# Patient Record
Sex: Female | Born: 1985 | Race: Black or African American | Hispanic: No | Marital: Single | Smoking: Never smoker
Health system: Southern US, Community
[De-identification: ages and names within clinical notes are randomized; demographics above are authoritative.]

## PROBLEM LIST (undated history)

## (undated) DIAGNOSIS — I10 Essential (primary) hypertension: Secondary | ICD-10-CM

## (undated) DIAGNOSIS — R87629 Unspecified abnormal cytological findings in specimens from vagina: Secondary | ICD-10-CM

## (undated) DIAGNOSIS — B009 Herpesviral infection, unspecified: Secondary | ICD-10-CM

## (undated) DIAGNOSIS — J45909 Unspecified asthma, uncomplicated: Secondary | ICD-10-CM

## (undated) HISTORY — DX: Herpesviral infection, unspecified: B00.9

## (undated) HISTORY — DX: Unspecified abnormal cytological findings in specimens from vagina: R87.629

## (undated) HISTORY — DX: Essential (primary) hypertension: I10

## (undated) HISTORY — PX: LEEP: SHX91

---

## 2010-06-19 ENCOUNTER — Emergency Department: Payer: Self-pay | Admitting: Emergency Medicine

## 2011-03-21 ENCOUNTER — Emergency Department: Payer: Self-pay | Admitting: Internal Medicine

## 2011-04-13 ENCOUNTER — Emergency Department: Payer: Self-pay | Admitting: Emergency Medicine

## 2011-04-15 ENCOUNTER — Emergency Department: Payer: Self-pay | Admitting: Unknown Physician Specialty

## 2014-02-27 ENCOUNTER — Emergency Department: Payer: Self-pay | Admitting: Emergency Medicine

## 2014-02-27 LAB — CBC
HCT: 37.2 % (ref 35.0–47.0)
HGB: 12.1 g/dL (ref 12.0–16.0)
MCH: 26.8 pg (ref 26.0–34.0)
MCHC: 32.5 g/dL (ref 32.0–36.0)
MCV: 83 fL (ref 80–100)
Platelet: 284 10*3/uL (ref 150–440)
RBC: 4.51 10*6/uL (ref 3.80–5.20)
RDW: 13.3 % (ref 11.5–14.5)
WBC: 9.2 10*3/uL (ref 3.6–11.0)

## 2014-02-27 LAB — BASIC METABOLIC PANEL
Anion Gap: 9 (ref 7–16)
BUN: 9 mg/dL (ref 7–18)
CREATININE: 0.84 mg/dL (ref 0.60–1.30)
Calcium, Total: 8.1 mg/dL — ABNORMAL LOW (ref 8.5–10.1)
Chloride: 110 mmol/L — ABNORMAL HIGH (ref 98–107)
Co2: 26 mmol/L (ref 21–32)
EGFR (African American): >60
EGFR (Non-African Amer.): >60
GLUCOSE: 92 mg/dL (ref 65–99)
Osmolality: 287 (ref 275–301)
Potassium: 3.2 mmol/L — ABNORMAL LOW (ref 3.5–5.1)
SODIUM: 145 mmol/L (ref 136–145)

## 2014-02-27 LAB — TROPONIN I: Troponin-I: <0.02 ng/mL

## 2014-08-25 ENCOUNTER — Emergency Department
Admit: 2014-08-25 | Disposition: A | Discharge status: Home / Self Care | Payer: Self-pay | Admitting: Emergency Medicine

## 2015-03-17 ENCOUNTER — Emergency Department: Payer: Medicaid Other

## 2015-03-17 ENCOUNTER — Emergency Department
Admission: EM | Admit: 2015-03-17 | Discharge: 2015-03-18 | Disposition: A | Discharge status: Home / Self Care | Payer: Medicaid Other | Attending: Emergency Medicine | Admitting: Emergency Medicine

## 2015-03-17 ENCOUNTER — Encounter: Payer: Self-pay | Admitting: Urgent Care

## 2015-03-17 DIAGNOSIS — Z3202 Encounter for pregnancy test, result negative: Secondary | ICD-10-CM | POA: Insufficient documentation

## 2015-03-17 DIAGNOSIS — R1011 Right upper quadrant pain: Secondary | ICD-10-CM

## 2015-03-17 DIAGNOSIS — N3000 Acute cystitis without hematuria: Secondary | ICD-10-CM | POA: Insufficient documentation

## 2015-03-17 DIAGNOSIS — R109 Unspecified abdominal pain: Secondary | ICD-10-CM | POA: Diagnosis present

## 2015-03-17 DIAGNOSIS — R1110 Vomiting, unspecified: Secondary | ICD-10-CM

## 2015-03-17 HISTORY — DX: Unspecified asthma, uncomplicated: J45.909

## 2015-03-17 LAB — URINALYSIS COMPLETE WITH MICROSCOPIC (ARMC ONLY)
BACTERIA UA: NONE SEEN
BILIRUBIN URINE: NEGATIVE
GLUCOSE, UA: NEGATIVE mg/dL
Ketones, ur: NEGATIVE mg/dL
Nitrite: NEGATIVE
Protein, ur: NEGATIVE mg/dL
Specific Gravity, Urine: 1.015 (ref 1.005–1.030)
pH: 7 (ref 5.0–8.0)

## 2015-03-17 LAB — CBC
HCT: 35.9 % (ref 35.0–47.0)
Hemoglobin: 11.3 g/dL — ABNORMAL LOW (ref 12.0–16.0)
MCH: 25.5 pg — ABNORMAL LOW (ref 26.0–34.0)
MCHC: 31.4 g/dL — ABNORMAL LOW (ref 32.0–36.0)
MCV: 81 fL (ref 80.0–100.0)
Platelets: 268 10*3/uL (ref 150–440)
RBC: 4.44 MIL/uL (ref 3.80–5.20)
RDW: 13.5 % (ref 11.5–14.5)
WBC: 11.3 10*3/uL — ABNORMAL HIGH (ref 3.6–11.0)

## 2015-03-17 LAB — COMPREHENSIVE METABOLIC PANEL
ALK PHOS: 53 U/L (ref 38–126)
ALT: 15 U/L (ref 14–54)
AST: 17 U/L (ref 15–41)
Albumin: 3.6 g/dL (ref 3.5–5.0)
Anion gap: 5 (ref 5–15)
BUN: 10 mg/dL (ref 6–20)
CHLORIDE: 112 mmol/L — AB (ref 101–111)
CO2: 25 mmol/L (ref 22–32)
CREATININE: 0.76 mg/dL (ref 0.44–1.00)
Calcium: 8.7 mg/dL — ABNORMAL LOW (ref 8.9–10.3)
GFR calc non Af Amer: >60 mL/min (ref >60)
GLUCOSE: 101 mg/dL — AB (ref 65–99)
Potassium: 3.5 mmol/L (ref 3.5–5.1)
SODIUM: 142 mmol/L (ref 135–145)
TOTAL PROTEIN: 7.5 g/dL (ref 6.5–8.1)
Total Bilirubin: 0.3 mg/dL (ref 0.3–1.2)

## 2015-03-17 LAB — LIPASE, BLOOD: Lipase: 53 U/L — ABNORMAL HIGH (ref 11–51)

## 2015-03-17 LAB — POCT PREGNANCY, URINE: Preg Test, Ur: NEGATIVE

## 2015-03-17 MED ORDER — SODIUM CHLORIDE 0.9 % IV BOLUS (SEPSIS)
1000.0000 mL | Freq: Once | INTRAVENOUS | Status: AC
  Administered 2015-03-18: 1000 mL via INTRAVENOUS

## 2015-03-17 NOTE — ED Provider Notes (Signed)
[facility] Emergency Department Provider Note  ____________________________________________  Time seen: 11:30 PM  I have reviewed the triage vital signs and the nursing notes.   HISTORY  Chief Complaint Abdominal Pain      HPI Patricia Brock is a 29 y.o. female presents with diffuse abdominal pain times one week accompanied by nonbloody emesis. She also admits to urinary frequency and urgency. Patient states current pain score 6 out of 10     Past Medical History  Diagnosis Date  . Asthma     There are no active problems to display for this patient.   History reviewed. No pertinent past surgical history.  Current Outpatient Rx  Name  Route  Sig  Dispense  Refill  . albuterol (PROVENTIL) (2.5 MG/3ML) 0.083% nebulizer solution   Nebulization   Take 2.5 mg by nebulization every 6 (six) hours as needed for wheezing or shortness of breath.           Allergies Review of patient's allergies indicates no known allergies.  No family history on file.  Social History Social History  Substance Use Topics  . Smoking status: Never Smoker   . Smokeless tobacco: None  . Alcohol Use: No    Review of Systems  Constitutional: Negative for fever. Eyes: Negative for visual changes. ENT: Negative for sore throat. Cardiovascular: Negative for chest pain. Respiratory: Negative for shortness of breath. Gastrointestinal: Positive abdominal pain and vomiting Genitourinary: Negative for dysuria. Musculoskeletal: Negative for back pain. Skin: Negative for rash. Neurological: Negative for headaches, focal weakness or numbness.   10-point ROS otherwise negative.  ____________________________________________   PHYSICAL EXAM:  VITAL SIGNS: ED Triage Vitals  Enc Vitals Group     BP 03/17/15 2157 155/94 mmHg     Pulse Rate 03/17/15 2157 81     Resp 03/17/15 2157 18     Temp 03/17/15 2157 98.8 F (37.1 C)     Temp Source 03/17/15 2157 Oral   SpO2 03/17/15 2157 100 %     Weight 03/17/15 2157 278 lb (126.1 kg)     Height 03/17/15 2157  (1.702 m)     Head Cir --      Peak Flow --      Pain Score 03/17/15 2157 6     Pain Loc --      Pain Edu? --      Excl. in GC? --      Constitutional: Alert and oriented. Well appearing and in no distress. Eyes: Conjunctivae are normal. PERRL. Normal extraocular movements. ENT   Head: Normocephalic and atraumatic.   Nose: No congestion/rhinnorhea.   Mouth/Throat: Mucous membranes are moist.   Neck: No stridor. Hematological/Lymphatic/Immunilogical: No cervical lymphadenopathy. Cardiovascular: Normal rate, regular rhythm. Normal and symmetric distal pulses are present in all extremities. No murmurs, rubs, or gallops. Respiratory: Normal respiratory effort without tachypnea nor retractions. Breath sounds are clear and equal bilaterally. No wheezes/rales/rhonchi. Gastrointestinal: Tender to palpation right upper quadrant. No distention. There is no CVA tenderness. Genitourinary: deferred Musculoskeletal: Nontender with normal range of motion in all extremities. No joint effusions.  No lower extremity tenderness nor edema. Neurologic:  Normal speech and language. No gross focal neurologic deficits are appreciated. Speech is normal.  Skin:  Skin is warm, dry and intact. No rash noted. Psychiatric: Mood and affect are normal. Speech and behavior are normal. Patient exhibits appropriate insight and judgment.  ____________________________________________    LABS (pertinent positives/negatives)  Labs Reviewed  LIPASE, BLOOD - Abnormal; Notable  for the following:    Lipase 53 (*)    All other components within normal limits  COMPREHENSIVE METABOLIC PANEL - Abnormal; Notable for the following:    Chloride 112 (*)    Glucose, Bld 101 (*)    Calcium 8.7 (*)    All other components within normal limits  CBC - Abnormal; Notable for the following:    WBC 11.3 (*)    Hemoglobin  11.3 (*)    MCH 25.5 (*)    MCHC 31.4 (*)    All other components within normal limits  URINALYSIS COMPLETEWITH MICROSCOPIC ([facility] ONLY) - Abnormal; Notable for the following:    Color, Urine YELLOW (*)    APPearance HAZY (*)    Hgb urine dipstick 2+ (*)    Leukocytes, UA 3+ (*)    Squamous Epithelial / LPF 6-30 (*)    All other components within normal limits  POC URINE PREG, ED  POCT PREGNANCY, URINE         RADIOLOGY  US Abdomen Limited RUQ (Final result) Result time: 03/18/15 00:42:46   Final result by Rad Results In Interface (03/18/15 00:42:46)   Narrative:   CLINICAL DATA: Right upper quadrant pain and vomiting  EXAM: US ABDOMEN LIMITED - RIGHT UPPER QUADRANT  COMPARISON: None.  FINDINGS: Gallbladder:  No gallstones or wall thickening visualized. No sonographic Murphy sign noted.  Common bile duct:  Diameter: 3 mm  Liver:  No focal lesion identified. Within normal limits in parenchymal echogenicity. Antegrade flow in the imaged hepatic and portal venous system.  IMPRESSION: Normal right upper quadrant ultrasound.   Electronically Signed By: Marnee SpringJonathon Watts M.D. On: 03/18/2015 00:42        ECG Results          INITIAL IMPRESSION / ASSESSMENT AND PLAN / ED COURSE  Pertinent labs & imaging results that were available during my care of the patient were reviewed by me and considered in my medical decision making (see chart for details).  History of physical exam consistent with acute cystitis as such patient received ciprofloxacin 500 mg and will be prescribed the same for home.  ____________________________________________   FINAL CLINICAL IMPRESSION(S) / ED DIAGNOSES  Final diagnoses:  Acute cystitis without hematuria      Darci Currentandolph N Gandolfi, MD 03/18/15 [phone]

## 2015-03-17 NOTE — ED Notes (Signed)
Patient presents with c/o diffuse abdominal pain x 1 week; worsening. (+) N/V reported. Patient denies fever and urinary symptoms.

## 2015-03-18 ENCOUNTER — Emergency Department: Payer: Medicaid Other

## 2015-03-18 MED ORDER — CIPROFLOXACIN HCL 500 MG PO TABS
500.0000 mg | ORAL_TABLET | Freq: Once | ORAL | Status: AC
Start: 2015-03-18 — End: 2015-03-18
  Administered 2015-03-18: 500 mg via ORAL
  Filled 2015-03-18: qty 1

## 2015-03-18 MED ORDER — KETOROLAC TROMETHAMINE 30 MG/ML IJ SOLN
30.0000 mg | Freq: Once | INTRAMUSCULAR | Status: AC
  Administered 2015-03-18: 30 mg via INTRAVENOUS

## 2015-03-18 MED ORDER — KETOROLAC TROMETHAMINE 30 MG/ML IJ SOLN
INTRAMUSCULAR | Status: AC
  Administered 2015-03-18: 30 mg via INTRAVENOUS
  Filled 2015-03-18: qty 1

## 2015-03-18 MED ORDER — CIPROFLOXACIN HCL 500 MG PO TABS: 500.0000 mg | ORAL_TABLET | Freq: Two times a day (BID) | ORAL | Status: DC

## 2015-03-18 NOTE — ED Notes (Signed)
NS infusing well at appropriate rate. No further needs at this time.

## 2015-03-18 NOTE — ED Notes (Signed)
Patient back from US.

## 2015-03-18 NOTE — ED Notes (Signed)
Patient with no complaints at this time. Respirations even and unlabored. Skin warm/dry. Discharge instructions reviewed with patient at this time. Patient given opportunity to voice concerns/ask questions. IV removed per policy and band-aid applied to site. Patient discharged at this time and left Emergency Department with steady gait.  

## 2015-03-18 NOTE — ED Notes (Signed)
Administered ordered medications and updated on US results/updated plan of care. No further needs at this time.

## 2015-03-18 NOTE — Discharge Instructions (Signed)

## 2015-03-18 NOTE — ED Notes (Signed)
Patient transported to Ultrasound 

## 2016-04-08 ENCOUNTER — Emergency Department
Admission: EM | Admit: 2016-04-08 | Discharge: 2016-04-08 | Disposition: A | Discharge status: Home / Self Care | Payer: Self-pay | Attending: Emergency Medicine | Admitting: Emergency Medicine

## 2016-04-08 ENCOUNTER — Emergency Department: Payer: Self-pay

## 2016-04-08 DIAGNOSIS — Z791 Long term (current) use of non-steroidal anti-inflammatories (NSAID): Secondary | ICD-10-CM | POA: Insufficient documentation

## 2016-04-08 DIAGNOSIS — J45909 Unspecified asthma, uncomplicated: Secondary | ICD-10-CM | POA: Insufficient documentation

## 2016-04-08 DIAGNOSIS — G43809 Other migraine, not intractable, without status migrainosus: Secondary | ICD-10-CM

## 2016-04-08 DIAGNOSIS — Z79899 Other long term (current) drug therapy: Secondary | ICD-10-CM | POA: Insufficient documentation

## 2016-04-08 LAB — COMPREHENSIVE METABOLIC PANEL
ALK PHOS: 47 U/L (ref 38–126)
ALT: 12 U/L — AB (ref 14–54)
AST: 16 U/L (ref 15–41)
Albumin: 3.3 g/dL — ABNORMAL LOW (ref 3.5–5.0)
Anion gap: 5 (ref 5–15)
BUN: 9 mg/dL (ref 6–20)
CALCIUM: 8.4 mg/dL — AB (ref 8.9–10.3)
CHLORIDE: 107 mmol/L (ref 101–111)
CO2: 27 mmol/L (ref 22–32)
CREATININE: 0.74 mg/dL (ref 0.44–1.00)
GFR calc non Af Amer: >60 mL/min (ref >60)
GLUCOSE: 97 mg/dL (ref 65–99)
Potassium: 3.6 mmol/L (ref 3.5–5.1)
SODIUM: 139 mmol/L (ref 135–145)
Total Bilirubin: 0.7 mg/dL (ref 0.3–1.2)
Total Protein: 7.3 g/dL (ref 6.5–8.1)

## 2016-04-08 LAB — CBC
HCT: 36.8 % (ref 35.0–47.0)
HEMOGLOBIN: 12.2 g/dL (ref 12.0–16.0)
MCH: 27.3 pg (ref 26.0–34.0)
MCHC: 33.3 g/dL (ref 32.0–36.0)
MCV: 81.9 fL (ref 80.0–100.0)
PLATELETS: 230 10*3/uL (ref 150–440)
RBC: 4.49 MIL/uL (ref 3.80–5.20)
RDW: 14 % (ref 11.5–14.5)
WBC: 10 10*3/uL (ref 3.6–11.0)

## 2016-04-08 LAB — POCT PREGNANCY, URINE: Preg Test, Ur: NEGATIVE

## 2016-04-08 MED ORDER — BUTALBITAL-APAP-CAFFEINE 50-325-40 MG PO TABS: 1.0000 | ORAL_TABLET | Freq: Four times a day (QID) | ORAL | 0 refills | Status: AC | PRN

## 2016-04-08 MED ORDER — METOCLOPRAMIDE HCL 5 MG/ML IJ SOLN
20.0000 mg | Freq: Once | INTRAVENOUS | Status: AC
  Administered 2016-04-08: 20 mg via INTRAVENOUS
  Filled 2016-04-08: qty 4

## 2016-04-08 MED ORDER — SODIUM CHLORIDE 0.9 % IV SOLN
1000.0000 mL | Freq: Once | INTRAVENOUS | Status: AC
  Administered 2016-04-08: 1000 mL via INTRAVENOUS

## 2016-04-08 MED ORDER — DIPHENHYDRAMINE HCL 50 MG/ML IJ SOLN
25.0000 mg | Freq: Once | INTRAMUSCULAR | Status: AC
Start: 2016-04-08 — End: 2016-04-08
  Administered 2016-04-08: 25 mg via INTRAVENOUS
  Filled 2016-04-08: qty 1

## 2016-04-08 NOTE — ED Notes (Addendum)
Pt reports nausea, vomiting and headache that started on Friday. No sick contacts.  Pt states the headache started in the left side and moving towards the back of her head. C/o 8/10 headache pain. No photosensitivity. Denies any fevers or hx of migraine. Pt states she feels acid reflux up her throat. Pt also states she has noticed increased swelling to her ankles specifically the left as well as the right wrist. Denies any injury. Pt did take ibuprofen around 2300 yesterday.  Pt ambulatory without difficulty.

## 2016-04-08 NOTE — ED Provider Notes (Signed)
[facility] Emergency Department Provider Note   ____________________________________________    I have reviewed the triage vital signs and the nursing notes.   HISTORY  Chief Complaint Headache and Emesis (Approximately 4 episodes)     HPI Patricia Brock is a 30 y.o. female who presents with complaints of headache. Patient reports she has had a global throbbing headache which started gradually 2 days ago. She reports it is been continuous throughout the weekend despite taking Motrin. She denies neuro deficits. No fevers or chills or neck pain. No recent travel. She has had some nausea. She denies a history of migraines. She also reports that she has noticed swelling in her feet and fingers intermittently when she wakes up in the morning.   Past Medical History:  Diagnosis Date  . Asthma     There are no active problems to display for this patient.   History reviewed. No pertinent surgical history.  Prior to Admission medications   Medication Sig Start Date End Date Taking? Authorizing Provider  albuterol (PROVENTIL) (2.5 MG/3ML) 0.083% nebulizer solution Take 2.5 mg by nebulization every 6 (six) hours as needed for wheezing or shortness of breath.    Historical Provider, MD  ciprofloxacin (CIPRO) 500 MG tablet Take 1 tablet (500 mg total) by mouth 2 (two) times daily. 03/18/15   Darci Currentandolph N Varon, MD  ibuprofen (ADVIL,MOTRIN) 200 MG tablet Take 200 mg by mouth every 6 (six) hours as needed.    Historical Provider, MD     Allergies Patient has no known allergies.  No family history on file.  Social History Social History  Substance Use Topics  . Smoking status: Never Smoker  . Smokeless tobacco: Not on file  . Alcohol use No    Review of Systems  Constitutional: No fever/chills Eyes: No visual changes.   Cardiovascular: Denies chest pain. Respiratory: Denies shortness of breath. Gastrointestinal: Nausea as above   Genitourinary:  Negative for dysuria. Musculoskeletal: Negative for neck pain Skin: Negative for rash. Neurological: Negative for  weakness  10-point ROS otherwise negative.  ____________________________________________   PHYSICAL EXAM:  VITAL SIGNS: ED Triage Vitals  Enc Vitals Group     BP 04/08/16 0629 (!) 164/96     Pulse Rate 04/08/16 0629 81     Resp 04/08/16 0629 18     Temp 04/08/16 0629 98.7 F (37.1 C)     Temp Source 04/08/16 0629 Oral     SpO2 04/08/16 0629 100 %     Weight 04/08/16 0624 280 lb (127 kg)     Height 04/08/16 0624 5\' 7"  (1.702 m)     Head Circumference --      Peak Flow --      Pain Score 04/08/16 0625 8     Pain Loc --      Pain Edu? --      Excl. in GC? --     Constitutional: Alert and oriented. No acute distress. Pleasant and interactive Eyes: Conjunctivae are normal. PERRLA, EOMI  Nose: No congestion/rhinnorhea. Mouth/Throat: Mucous membranes are moist.   Neck:  Painless ROM Cardiovascular: Normal rate, regular rhythm.  Good peripheral circulation. Respiratory: Normal respiratory effort.  No retractions.  Gastrointestinal: Soft and nontender. No distention.  No CVA tenderness. Genitourinary: deferred Musculoskeletal: No lower extremity tenderness nor edema.  Warm and well perfused Neurologic:  Normal speech and language. No gross focal neurologic deficits are appreciated. Cranial nerves II-12 are normal Skin:  Skin is warm, dry and intact.  No rash noted. Psychiatric: Mood and affect are normal. Speech and behavior are normal.  ____________________________________________   LABS (all labs ordered are listed, but only abnormal results are displayed)  Labs Reviewed - No data to display ____________________________________________  EKG  None ____________________________________________  RADIOLOGY  CT head Unremarkable ____________________________________________   PROCEDURES  Procedure(s) performed: No    Critical Care performed:  No ____________________________________________   INITIAL IMPRESSION / ASSESSMENT AND PLAN / ED COURSE  Pertinent labs & imaging results that were available during my care of the patient were reviewed by me and considered in my medical decision making (see chart for details).  Patient presented with gradual onset headache, no history of migraines. She also is concerned about edema which is been happening for some time in the mornings. We will check labs, treat her headache and obtain imaging of the head  Clinical Course   CT head is unremarkable, patient has complete relief of her headache with treatment. She feels well. No neuro deficits. She is appropriate for discharge and outpatient follow-up for her blood pressure. Return precautions discussed. ____________________________________________   FINAL CLINICAL IMPRESSION(S) / ED DIAGNOSES  Final diagnoses:  Other migraine without status migrainosus, not intractable      NEW MEDICATIONS STARTED DURING THIS VISIT:  New Prescriptions   No medications on file     Note:  This document was prepared using Dragon voice recognition software and may include unintentional dictation errors.    Jene Everyobert Pradeep Beaubrun, MD 04/08/16 [phone]

## 2016-04-08 NOTE — ED Triage Notes (Signed)
Patient to ED by herself for complaints of headache and vomiting that started yesterday. Patient is able to speak in full sentences without difficulty. Denies photo and phonosensitivity.

## 2016-04-16 ENCOUNTER — Ambulatory Visit: Payer: Medicaid Other

## 2016-04-18 ENCOUNTER — Ambulatory Visit: Payer: Self-pay | Attending: Oncology | Admitting: *Deleted

## 2016-04-18 VITALS — BP 133/82 | HR 79 | Temp 97.4°F | Resp 18 | Ht 68.11 in | Wt 314.4 lb

## 2016-04-18 DIAGNOSIS — R87613 High grade squamous intraepithelial lesion on cytologic smear of cervix (HGSIL): Secondary | ICD-10-CM

## 2016-04-18 NOTE — Progress Notes (Signed)
Patient referred to [facility] by [facility] for further assistance for abnormal pap of HGSIL.  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  The LEEP would be for treatment purposes and not diagnostic purposes.  Joellyn QuailsChristy Burton has left Liborio NixonJanice, at the health department a message to [facility] the [facility] since she is not eligible for [facility].

## 2016-11-11 ENCOUNTER — Emergency Department
Admission: EM | Admit: 2016-11-11 | Discharge: 2016-11-12 | Disposition: A | Discharge status: Home / Self Care | Payer: Self-pay | Attending: Emergency Medicine | Admitting: Emergency Medicine

## 2016-11-11 ENCOUNTER — Encounter: Payer: Self-pay | Admitting: Emergency Medicine

## 2016-11-11 DIAGNOSIS — A09 Infectious gastroenteritis and colitis, unspecified: Secondary | ICD-10-CM | POA: Insufficient documentation

## 2016-11-11 DIAGNOSIS — N3001 Acute cystitis with hematuria: Secondary | ICD-10-CM | POA: Insufficient documentation

## 2016-11-11 DIAGNOSIS — R197 Diarrhea, unspecified: Secondary | ICD-10-CM

## 2016-11-11 LAB — CBC
HCT: 42.4 % (ref 35.0–47.0)
Hemoglobin: 14.2 g/dL (ref 12.0–16.0)
MCH: 26.8 pg (ref 26.0–34.0)
MCHC: 33.5 g/dL (ref 32.0–36.0)
MCV: 80 fL (ref 80.0–100.0)
PLATELETS: 256 10*3/uL (ref 150–440)
RBC: 5.3 MIL/uL — ABNORMAL HIGH (ref 3.80–5.20)
RDW: 13.6 % (ref 11.5–14.5)
WBC: 7.8 10*3/uL (ref 3.6–11.0)

## 2016-11-11 LAB — COMPREHENSIVE METABOLIC PANEL
ALK PHOS: 56 U/L (ref 38–126)
ALT: 16 U/L (ref 14–54)
AST: 20 U/L (ref 15–41)
Albumin: 3.9 g/dL (ref 3.5–5.0)
Anion gap: 8 (ref 5–15)
BILIRUBIN TOTAL: 0.4 mg/dL (ref 0.3–1.2)
BUN: 10 mg/dL (ref 6–20)
CALCIUM: 9.1 mg/dL (ref 8.9–10.3)
CO2: 21 mmol/L — ABNORMAL LOW (ref 22–32)
CREATININE: 0.93 mg/dL (ref 0.44–1.00)
Chloride: 111 mmol/L (ref 101–111)
GFR calc Af Amer: >60 mL/min (ref >60)
Glucose, Bld: 100 mg/dL — ABNORMAL HIGH (ref 65–99)
Potassium: 3.2 mmol/L — ABNORMAL LOW (ref 3.5–5.1)
Sodium: 140 mmol/L (ref 135–145)
Total Protein: 8.6 g/dL — ABNORMAL HIGH (ref 6.5–8.1)

## 2016-11-11 LAB — LIPASE, BLOOD: Lipase: 34 U/L (ref 11–51)

## 2016-11-11 NOTE — ED Notes (Signed)
Pt was unable to provide adequate amount for urine sample at this time and was given a urine cup to provide a specimen in when she is able

## 2016-11-11 NOTE — ED Triage Notes (Addendum)
Pt reports frequent watery diarrhea since Friday with lower abd cramping.  Pt states she has not been able to urinate since Friday.

## 2016-11-12 ENCOUNTER — Emergency Department: Payer: Self-pay

## 2016-11-12 ENCOUNTER — Encounter: Payer: Self-pay | Admitting: Radiology

## 2016-11-12 LAB — URINALYSIS, COMPLETE (UACMP) WITH MICROSCOPIC
Glucose, UA: NEGATIVE mg/dL
KETONES UR: NEGATIVE mg/dL
NITRITE: NEGATIVE
PH: 5 (ref 5.0–8.0)
Protein, ur: 100 mg/dL — AB
Specific Gravity, Urine: 1.035 — ABNORMAL HIGH (ref 1.005–1.030)

## 2016-11-12 LAB — PREGNANCY, URINE: PREG TEST UR: NEGATIVE

## 2016-11-12 MED ORDER — CIPROFLOXACIN HCL 500 MG PO TABS
500.0000 mg | ORAL_TABLET | Freq: Once | ORAL | Status: AC
  Administered 2016-11-12: 500 mg via ORAL
  Filled 2016-11-12: qty 1

## 2016-11-12 MED ORDER — IOPAMIDOL (ISOVUE-300) INJECTION 61%
100.0000 mL | Freq: Once | INTRAVENOUS | Status: AC | PRN
  Administered 2016-11-12: 100 mL via INTRAVENOUS

## 2016-11-12 MED ORDER — SODIUM CHLORIDE 0.9 % IV BOLUS (SEPSIS)
1000.0000 mL | Freq: Once | INTRAVENOUS | Status: AC
  Administered 2016-11-12: 1000 mL via INTRAVENOUS

## 2016-11-12 MED ORDER — CIPROFLOXACIN HCL 500 MG PO TABS: 500.0000 mg | ORAL_TABLET | Freq: Two times a day (BID) | ORAL | 0 refills | Status: AC

## 2016-11-12 MED ORDER — ETODOLAC 200 MG PO CAPS: 200.0000 mg | ORAL_CAPSULE | Freq: Three times a day (TID) | ORAL | 0 refills | Status: AC

## 2016-11-12 NOTE — ED Notes (Signed)
Patient to CT via stretcher.

## 2016-11-12 NOTE — ED Provider Notes (Signed)
[facility] Emergency Department Provider Note   ____________________________________________   First MD Initiated Contact with Patient 11/12/16 [phone]     (approximate)  I have reviewed the triage vital signs and the nursing notes.   HISTORY  Chief Complaint Diarrhea    HPI Patricia Brock is a 31 y.o. female who comes into the hospital today with watery diarrhea. She reports it is been going on since Friday. The patient reports that she has not been urinating. She states that she's had too many episodes of diarrhea to count. She has some crampy abdominal pain as well that she rates a 7 out of 10 in intensity. The patient denies any vomiting but has had some nausea. She denies any sick contacts. She started getting lightheaded which is what made her concerned and had her come into the hospital today. The patient has not eaten any new foods nor has she been on any antibiotics. She thinks she had a fever on Friday but she never took her temperature. The patient's stool has been [place] and watery with no blood. The patient is here today for evaluation of her symptoms.   Past Medical History:  Diagnosis Date  . Asthma     There are no active problems to display for this patient.   Past Surgical History:  Procedure Laterality Date  . LEEP      Prior to Admission medications   Medication Sig Start Date End Date Taking? Authorizing Provider  albuterol (PROVENTIL) (2.5 MG/3ML) 0.083% nebulizer solution Take 2.5 mg by nebulization every 6 (six) hours as needed for wheezing or shortness of breath.    [provider]  butalbital-acetaminophen-caffeine (FIORICET, ESGIC) 50-325-40 MG tablet Take 1-2 tablets by mouth every 6 (six) hours as needed for headache. 04/08/16 04/08/17  Jene Every, MD  ciprofloxacin (CIPRO) 500 MG tablet Take 1 tablet (500 mg total) by mouth 2 (two) times daily. 03/18/15   Darci Current, MD  ciprofloxacin (CIPRO) 500 MG tablet  Take 1 tablet (500 mg total) by mouth 2 (two) times daily. 11/12/16 11/19/16  Rebecka Apley, MD  etodolac (LODINE) 200 MG capsule Take 1 capsule (200 mg total) by mouth every 8 (eight) hours. 11/12/16   Rebecka Apley, MD  ibuprofen (ADVIL,MOTRIN) 200 MG tablet Take 200 mg by mouth every 6 (six) hours as needed.    [provider]    Allergies Patient has no known allergies.  No family history on file.  Social History Social History  Substance Use Topics  . Smoking status: Never Smoker  . Smokeless tobacco: Not on file  . Alcohol use No    Review of Systems  Constitutional: No fever/chills Eyes: No visual changes. ENT: No sore throat. Cardiovascular: Denies chest pain. Respiratory: Denies shortness of breath. Gastrointestinal: abdominal pain, nausea, diarrhea.  No constipation. Genitourinary: Negative for dysuria. Musculoskeletal: Negative for back pain. Skin: Negative for rash. Neurological: light headedness.   ____________________________________________   PHYSICAL EXAM:  VITAL SIGNS: ED Triage Vitals  Enc Vitals Group     BP 11/11/16 2141 (!) 139/99     Pulse Rate 11/11/16 2141 (!) 113     Resp 11/11/16 2141 20     Temp 11/11/16 2141 98.9 F (37.2 C)     Temp Source 11/11/16 2141 Oral     SpO2 11/11/16 2141 100 %     Weight 11/11/16 2142 300 lb (136.1 kg)     Height 11/11/16 2142 5\' 7"  (1.702 m)  Head Circumference --      Peak Flow --      Pain Score 11/11/16 2141 7     Pain Loc --      Pain Edu? --      Excl. in GC? --     Constitutional: Alert and oriented. Well appearing and in mild distress. Eyes: Conjunctivae are normal. PERRL. EOMI. Head: Atraumatic. Nose: No congestion/rhinnorhea. Mouth/Throat: Mucous membranes are moist.  Oropharynx non-erythematous. Cardiovascular: Normal rate, regular rhythm. Grossly normal heart sounds.  Good peripheral circulation. Respiratory: Normal respiratory effort.  No retractions. Lungs  CTAB. Gastrointestinal: Soft with some LLQ abd pain. No distention. Positive bowel sounds Musculoskeletal: No lower extremity tenderness nor edema.   Neurologic:  Normal speech and language.  Skin:  Skin is warm, dry and intact.  Psychiatric: Mood and affect are normal.   ____________________________________________   LABS (all labs ordered are listed, but only abnormal results are displayed)  Labs Reviewed  COMPREHENSIVE METABOLIC PANEL - Abnormal; Notable for the following:       Result Value   Potassium 3.2 (*)    CO2 21 (*)    Glucose, Bld 100 (*)    Total Protein 8.6 (*)    All other components within normal limits  CBC - Abnormal; Notable for the following:    RBC 5.30 (*)    All other components within normal limits  URINALYSIS, COMPLETE (UACMP) WITH MICROSCOPIC - Abnormal; Notable for the following:    Color, Urine AMBER (*)    APPearance TURBID (*)    Specific Gravity, Urine 1.035 (*)    Hgb urine dipstick SMALL (*)    Bilirubin Urine SMALL (*)    Protein, ur 100 (*)    Leukocytes, UA LARGE (*)    Bacteria, UA MANY (*)    Squamous Epithelial / LPF TOO NUMEROUS TO COUNT (*)    All other components within normal limits  URINE CULTURE  LIPASE, BLOOD  PREGNANCY, URINE   ____________________________________________  EKG  none ____________________________________________  RADIOLOGY  Ct Abdomen Pelvis W Contrast  Result Date: 11/12/2016 CLINICAL DATA:  31 year old female with abdominal pain and diarrhea. EXAM: CT ABDOMEN AND PELVIS WITH CONTRAST TECHNIQUE: Multidetector CT imaging of the abdomen and pelvis was performed using the standard protocol following bolus administration of intravenous contrast. CONTRAST:  100mL ISOVUE-300 IOPAMIDOL (ISOVUE-300) INJECTION 61% COMPARISON:  None. FINDINGS: Lower chest: The visualized lung bases are clear. No intra-abdominal free air or free fluid. Hepatobiliary: No focal liver abnormality is seen. No gallstones, gallbladder wall  thickening, or biliary dilatation. Pancreas: Unremarkable. No pancreatic ductal dilatation or surrounding inflammatory changes. Spleen: Normal in size without focal abnormality. Adrenals/Urinary Tract: Adrenal glands are unremarkable. Kidneys are normal, without renal calculi, focal lesion, or hydronephrosis. Bladder is unremarkable. Stomach/Bowel: Liquid stool noted throughout the colon compatible with diarrheal state. Correlation with clinical exam and stool cultures recommended. There is no evidence of bowel obstruction or active inflammation. Normal appendix. Vascular/Lymphatic: No significant vascular findings are present. No enlarged abdominal or pelvic lymph nodes. Reproductive: The uterus is anteverted and grossly unremarkable. There is a 2.5 cm right ovarian dominant follicle/cyst. The left ovary is unremarkable. Other: None Musculoskeletal: Grade 1 L5-S1 retrolisthesis. 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 No acute or significant osseous findings. IMPRESSION: Diarrheal state. Correlation with clinical exam and stool cultures recommended. No bowel obstruction or active inflammation. Normal appendix. Electronically Signed   By: Elgie CollardArash  Radparvar M.D.   On: 11/12/2016 02:18    ____________________________________________  PROCEDURES  Procedure(s) performed: None  Procedures  Critical Care performed: No  ____________________________________________   INITIAL IMPRESSION / ASSESSMENT AND PLAN / ED COURSE  Pertinent labs & imaging results that were available during my care of the patient were reviewed by me and considered in my medical decision making (see chart for details).  This is a 31 year old female who comes into the hospital today with some diarrhea and lightheadedness. I did give the patient a liter of normal saline and I sent her for CT scan. The patient's CT scan shows some diarrheal disease but her urine was not clean. She does have some white blood cells as well as red  blood cells in her urine. I will give the patient a short course of ciprofloxacin to help treat her UTI as well as her diarrhea. The patient has not vomited in the emergency department. She reports though that she does feel improved. She'll be discharged home to follow-up with her primary care physician. The pain is improved.  Clinical Course as of Nov 13 734  Mon Nov 12, 2016  1610 Diarrheal state. Correlation with clinical exam and stool cultures recommended. No bowel obstruction or active inflammation. Normal appendix.   CT Abdomen Pelvis W Contrast [AW]    Clinical Course User Index [AW] Rebecka Apley, MD     ____________________________________________   FINAL CLINICAL IMPRESSION(S) / ED DIAGNOSES  Final diagnoses:  Diarrhea of presumed infectious origin  Acute cystitis with hematuria      NEW MEDICATIONS STARTED DURING THIS VISIT:  Discharge Medication List as of 11/12/2016  2:56 AM    START taking these medications   Details  !! ciprofloxacin (CIPRO) 500 MG tablet Take 1 tablet (500 mg total) by mouth 2 (two) times daily., Starting Mon 11/12/2016, Until Mon 11/19/2016, Print    etodolac (LODINE) 200 MG capsule Take 1 capsule (200 mg total) by mouth every 8 (eight) hours., Starting Mon 11/12/2016, Print     !! - Potential duplicate medications found. Please discuss with provider.       Note:  This document was prepared using Dragon voice recognition software and may include unintentional dictation errors.    Rebecka Apley, MD 11/12/16 [phone]

## 2016-11-12 NOTE — ED Notes (Signed)
Patient reports symptoms since Friday.  Reports watery diarrhea to many times to count.  Patient also reports having difficulty urinating since Friday, and abdominal cramping.

## 2016-11-12 NOTE — Discharge Instructions (Signed)
Please follow-up with your primary care physician. Please continue drinking fluids and please return with any worsening conditions or symptoms.

## 2016-11-12 NOTE — ED Notes (Signed)

## 2016-11-13 LAB — URINE CULTURE

## 2017-04-21 ENCOUNTER — Encounter: Payer: Self-pay | Admitting: Emergency Medicine

## 2017-04-21 DIAGNOSIS — Z79899 Other long term (current) drug therapy: Secondary | ICD-10-CM | POA: Diagnosis not present

## 2017-04-21 DIAGNOSIS — J4541 Moderate persistent asthma with (acute) exacerbation: Secondary | ICD-10-CM | POA: Insufficient documentation

## 2017-04-21 DIAGNOSIS — R062 Wheezing: Secondary | ICD-10-CM | POA: Diagnosis present

## 2017-04-21 MED ORDER — IPRATROPIUM-ALBUTEROL 0.5-2.5 (3) MG/3ML IN SOLN
3.0000 mL | Freq: Once | RESPIRATORY_TRACT | Status: AC
Start: 2017-04-21 — End: 2017-04-21
  Administered 2017-04-21: 3 mL via RESPIRATORY_TRACT

## 2017-04-21 MED ORDER — IPRATROPIUM-ALBUTEROL 0.5-2.5 (3) MG/3ML IN SOLN
RESPIRATORY_TRACT | Status: AC
Start: 2017-04-21 — End: 2017-04-21
  Administered 2017-04-21: 3 mL via RESPIRATORY_TRACT
  Filled 2017-04-21: qty 3

## 2017-04-21 NOTE — ED Triage Notes (Signed)
Patient with complaint of shortness of breath from her asthma times one week. Patient states that she has been using her inhaler but it has not been helping today. Patient with wheezing throughout.

## 2017-04-22 ENCOUNTER — Emergency Department
Admission: EM | Admit: 2017-04-22 | Discharge: 2017-04-22 | Disposition: A | Discharge status: Home / Self Care | Payer: Managed Care, Other (non HMO) | Attending: Emergency Medicine | Admitting: Emergency Medicine

## 2017-04-22 DIAGNOSIS — J4541 Moderate persistent asthma with (acute) exacerbation: Secondary | ICD-10-CM

## 2017-04-22 MED ORDER — IPRATROPIUM-ALBUTEROL 0.5-2.5 (3) MG/3ML IN SOLN
3.0000 mL | Freq: Once | RESPIRATORY_TRACT | Status: AC
  Administered 2017-04-22: 3 mL via RESPIRATORY_TRACT
  Filled 2017-04-22: qty 3

## 2017-04-22 MED ORDER — PREDNISONE 20 MG PO TABS
60.0000 mg | ORAL_TABLET | Freq: Once | ORAL | Status: AC
  Administered 2017-04-22: 60 mg via ORAL
  Filled 2017-04-22: qty 3

## 2017-04-22 MED ORDER — PREDNISONE 20 MG PO TABS: 60.0000 mg | ORAL_TABLET | Freq: Every day | ORAL | 0 refills | Status: DC

## 2017-04-22 MED ORDER — IPRATROPIUM-ALBUTEROL 0.5-2.5 (3) MG/3ML IN SOLN
3.0000 mL | Freq: Once | RESPIRATORY_TRACT | Status: AC
Start: 2017-04-22 — End: 2017-04-22
  Administered 2017-04-22: 3 mL via RESPIRATORY_TRACT
  Filled 2017-04-22: qty 3

## 2017-04-22 NOTE — Discharge Instructions (Signed)
Please follow up with your primary care physician.

## 2017-04-22 NOTE — ED Notes (Signed)
edp at bedside. Pt reporting feeling relief at this time. wheezing improved

## 2017-04-22 NOTE — ED Notes (Signed)

## 2017-04-22 NOTE — ED Provider Notes (Signed)
[facility] Emergency Department Provider Note   ____________________________________________   First MD Initiated Contact with Patient 04/22/17 0016     (approximate)  I have reviewed the triage vital signs and the nursing notes.   HISTORY  Chief Complaint Asthma    HPI Patricia Brock is a 31 y.o. female who comes into the hospital today with some asthma flaring up.  She reports that she tried using her inhaler but has not been working.  The patient reports that is been going on all week.  She has been wheezing and coughing.  She has had no sick contacts.  Her chest is tight.  She has had some nausea and vomiting from all the coughing.  She took some Alka-Seltzer cold and flu but came in for further evaluation.   Past Medical History:  Diagnosis Date  . Asthma     There are no active problems to display for this patient.   Past Surgical History:  Procedure Laterality Date  . LEEP      Prior to Admission medications   Medication Sig Start Date End Date Taking? Authorizing Provider  albuterol (PROVENTIL) (2.5 MG/3ML) 0.083% nebulizer solution Take 2.5 mg by nebulization every 6 (six) hours as needed for wheezing or shortness of breath.    [provider]  ciprofloxacin (CIPRO) 500 MG tablet Take 1 tablet (500 mg total) by mouth 2 (two) times daily. 03/18/15   Darci CurrentBrown,  N, MD  etodolac (LODINE) 200 MG capsule Take 1 capsule (200 mg total) by mouth every 8 (eight) hours. 11/12/16   Rebecka ApleyWebster, Allison P, MD  ibuprofen (ADVIL,MOTRIN) 200 MG tablet Take 200 mg by mouth every 6 (six) hours as needed.    [provider]  predniSONE (DELTASONE) 20 MG tablet Take 3 tablets (60 mg total) by mouth daily. 04/22/17   Rebecka ApleyWebster, Allison P, MD    Allergies Patient has no known allergies.  No family history on file.  Social History Social History   Tobacco Use  . Smoking status: Never Smoker  . Smokeless tobacco: Never Used  Substance  Use Topics  . Alcohol use: No  . Drug use: No    Review of Systems  Constitutional: No fever/chills Eyes: No visual changes. ENT: No sore throat. Cardiovascular: Chest tightness Respiratory: Cough and shortness of breath. Gastrointestinal: No abdominal pain.  No nausea, no vomiting.  No diarrhea.  No constipation. Genitourinary: Negative for dysuria. Musculoskeletal: Negative for back pain. Skin: Negative for rash. Neurological: Negative for headaches, focal weakness or numbness.   ____________________________________________   PHYSICAL EXAM:  VITAL SIGNS: ED Triage Vitals  Enc Vitals Group     BP 04/21/17 2049 (!) 142/87     Pulse Rate 04/21/17 2049 95     Resp 04/21/17 2049 20     Temp 04/21/17 2049 98.9 F (37.2 C)     Temp Source 04/21/17 2049 Oral     SpO2 04/21/17 2049 98 %     Weight 04/21/17 2050 280 lb (127 kg)     Height 04/21/17 2050 5\' 7"  (1.702 m)     Head Circumference --      Peak Flow --      Pain Score --      Pain Loc --      Pain Edu? --      Excl. in GC? --     Constitutional: Alert and oriented. Well appearing and in mild distress. Eyes: Conjunctivae are normal. PERRL. EOMI. Head: Atraumatic. Nose:  No congestion/rhinnorhea. Mouth/Throat: Mucous membranes are moist.  Oropharynx non-erythematous. Cardiovascular: Normal rate, regular rhythm. Grossly normal heart sounds.  Good peripheral circulation. Respiratory: Increased respiratory effort.  No retractions.  Diffuse expiratory wheezes Gastrointestinal: Soft and nontender. No distention. No abdominal bruits. No CVA tenderness. Musculoskeletal: No lower extremity tenderness nor edema.   Neurologic:  Normal speech and language.  Skin:  Skin is warm, dry and intact.  Psychiatric: Mood and affect are normal.   ____________________________________________   LABS (all labs ordered are listed, but only abnormal results are displayed)  Labs Reviewed - No data to  display ____________________________________________  EKG  none ____________________________________________  RADIOLOGY  No results found.  ____________________________________________   PROCEDURES  Procedure(s) performed: None  Procedures  Critical Care performed: No  ____________________________________________   INITIAL IMPRESSION / ASSESSMENT AND PLAN / ED COURSE  As part of my medical decision making, I reviewed the following data within the electronic MEDICAL RECORD NUMBER Notes from prior ED visits and [place] Controlled Substance Database   This is a 31 year old female who comes into the hospital today with an asthma exacerbation.  I did give the patient 2 DuoNeb treatments and she received 1 in triage.  Also gave the patient a dose of prednisone.  After receiving her treatments the patient's breathing was much improved.  She reports that she feels much better.  She will be discharged home with some steroids as well as instructions to continue using her inhaler.  She should follow-up with her primary care physician for further evaluation.      ____________________________________________   FINAL CLINICAL IMPRESSION(S) / ED DIAGNOSES  Final diagnoses:  Moderate persistent asthma with exacerbation     ED Discharge Orders        Ordered    predniSONE (DELTASONE) 20 MG tablet  Daily     04/22/17 0139       Note:  This document was prepared using Dragon voice recognition software and may include unintentional dictation errors.    Rebecka ApleyWebster, Allison P, MD 04/22/17 [phone]

## 2017-10-02 IMAGING — CT CT ABD-PELV W/ CM
2 of 4 series · 16 of 46 positions shown, 18 images · IV contrast (APPLIED)
Comparison: None.

CLINICAL DATA: 30-year-old female with abdominal pain and diarrhea.

EXAM:
CT ABDOMEN AND PELVIS WITH CONTRAST
TECHNIQUE: Multidetector CT imaging of the abdomen and pelvis was performed
using the standard protocol following bolus administration of
intravenous contrast.
CONTRAST:  100mL [id number] IOPAMIDOL ([id number]) INJECTION 61%

[Series 2: routine abd/pel with · axial · 0.80mm/px · z∈[-1273,-773]mm · 13 of 110 slices shown, 15 images]
[im 5/110  soft-tissue]
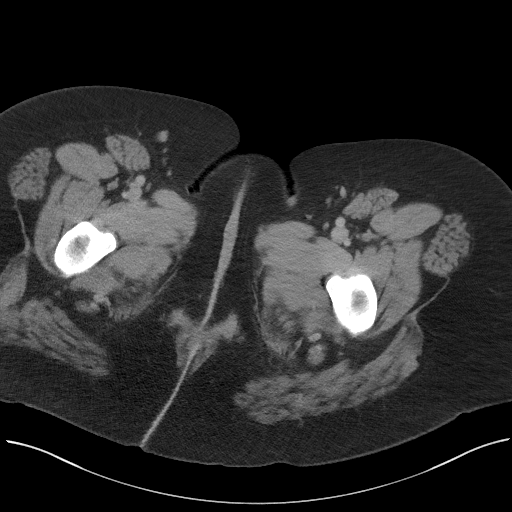
[im 5/110  bone]
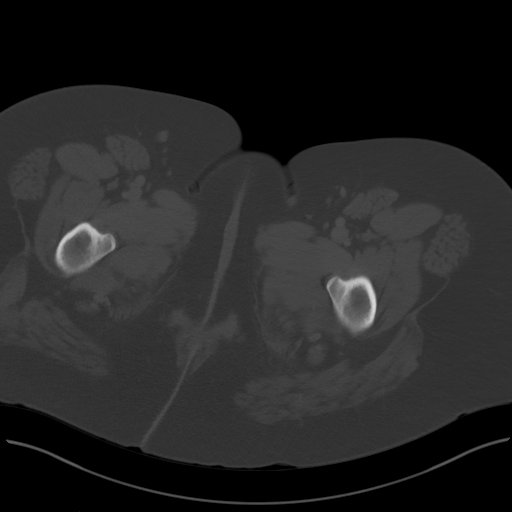
[im 14/110  soft-tissue]
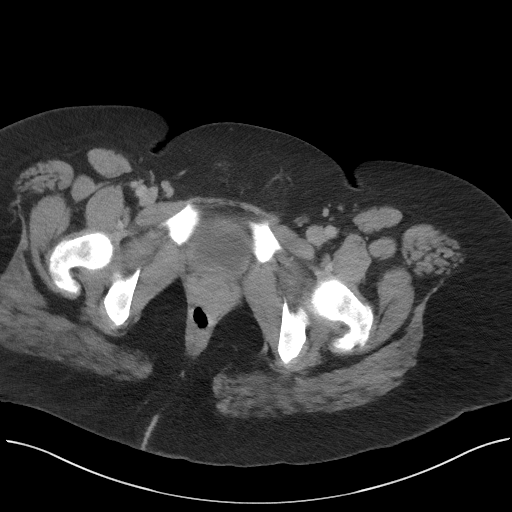
[im 23/110  soft-tissue]
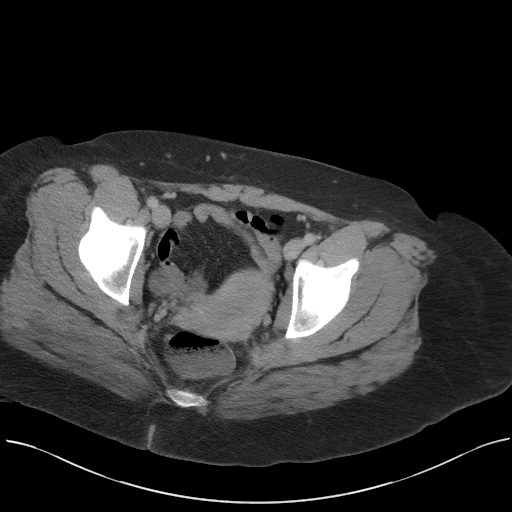
[im 32/110  soft-tissue]
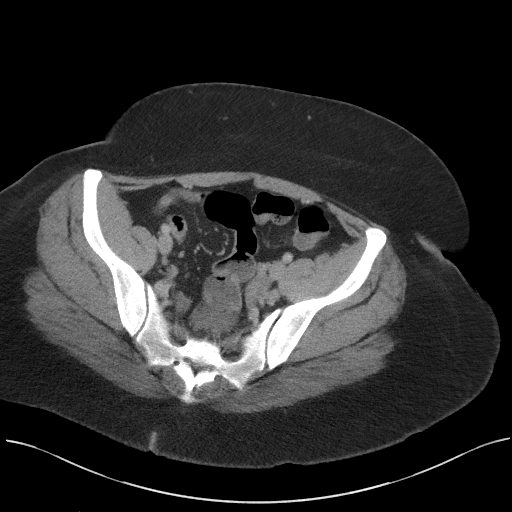
[im 37/110  soft-tissue]
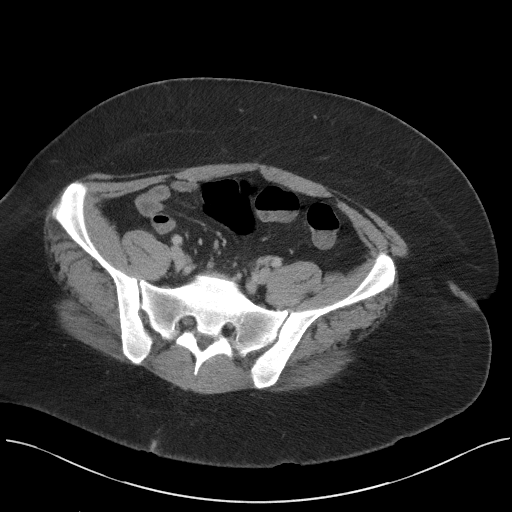
[im 46/110  soft-tissue]
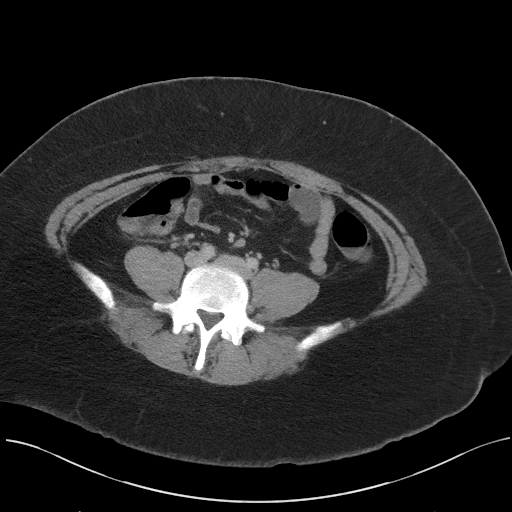
[im 55/110  soft-tissue]
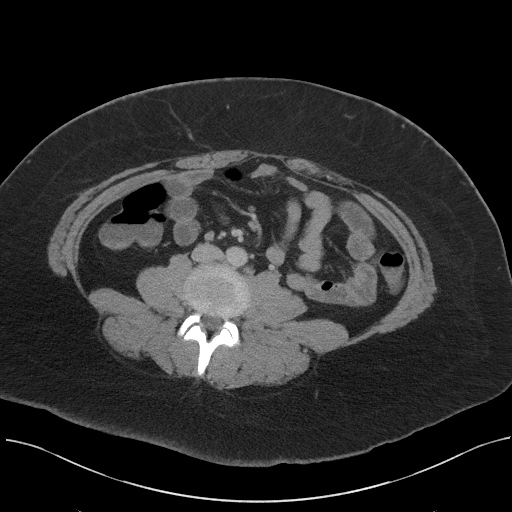
[im 64/110  soft-tissue]
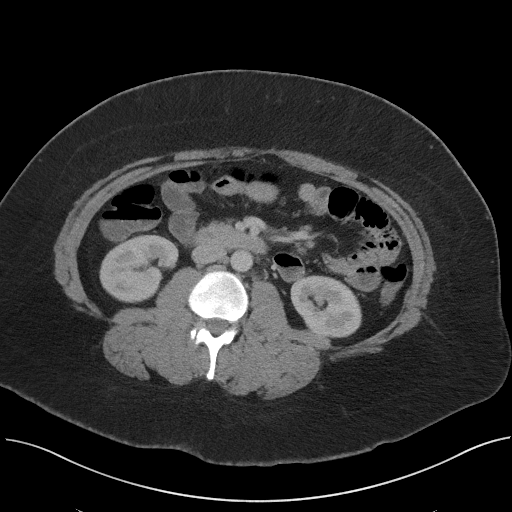
[im 73/110  soft-tissue]
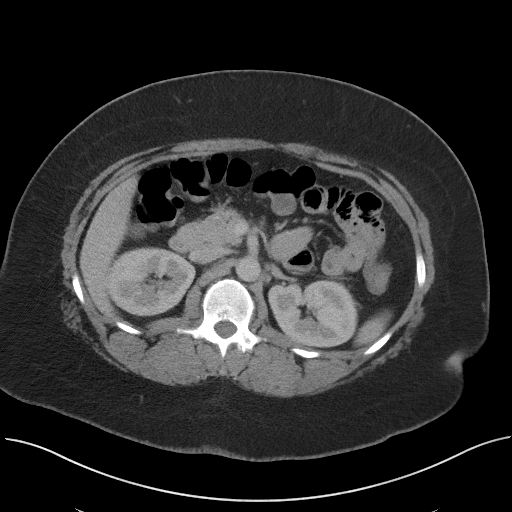
[im 73/110  bone]
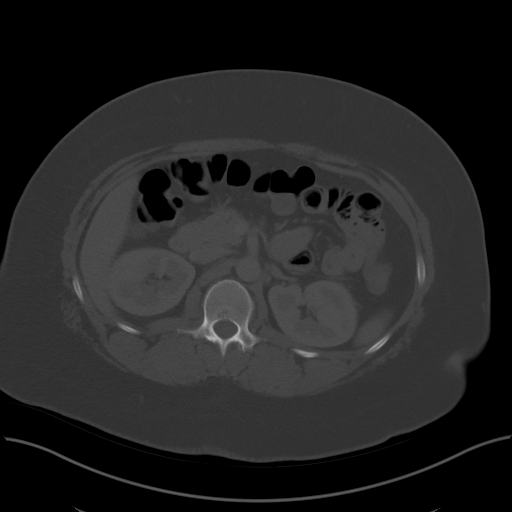
[im 78/110  soft-tissue]
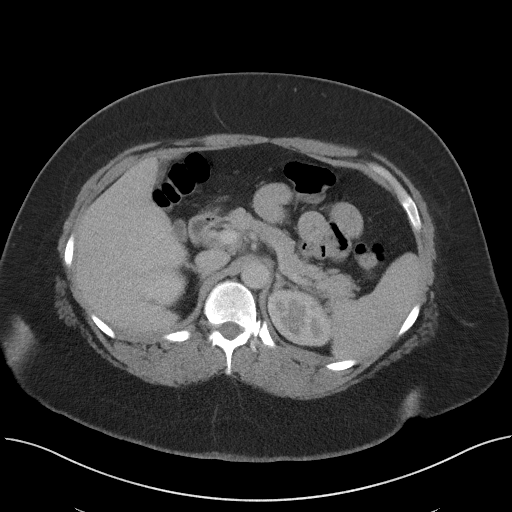
[im 87/110  soft-tissue]
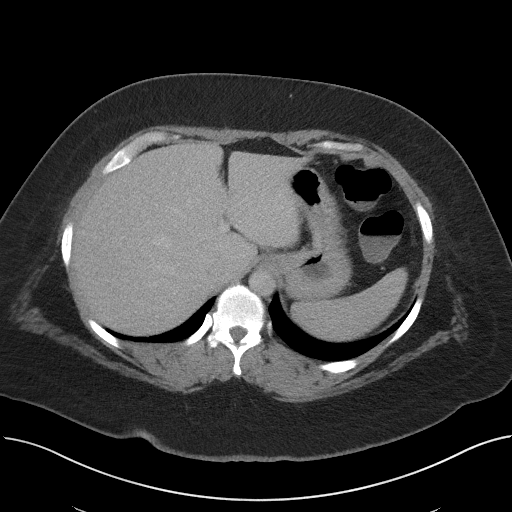
[im 96/110  soft-tissue]
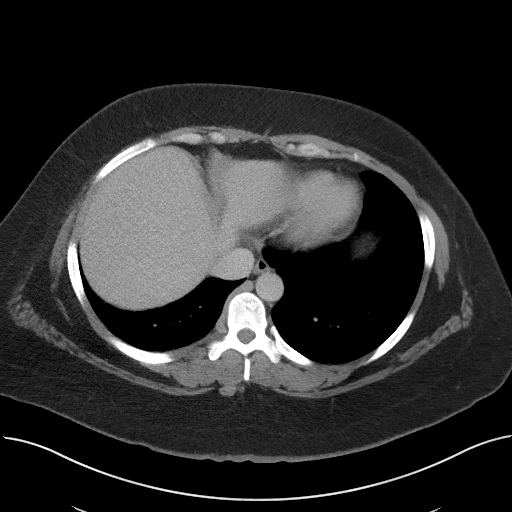
[im 105/110  soft-tissue]
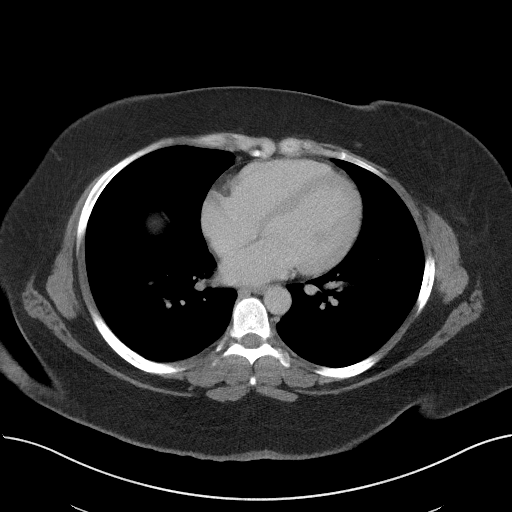

[Series 5: coronal st · coronal · 0.79mm/px · 3 of 98 slices shown]
[im 33/98  soft-tissue]
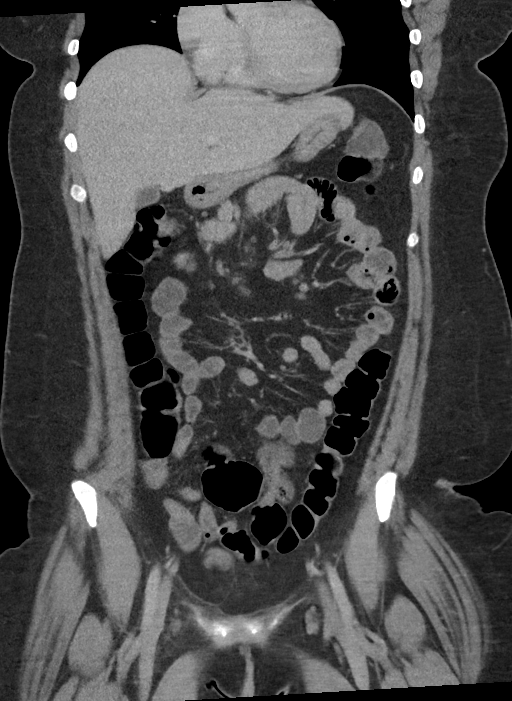
[im 44/98  soft-tissue]
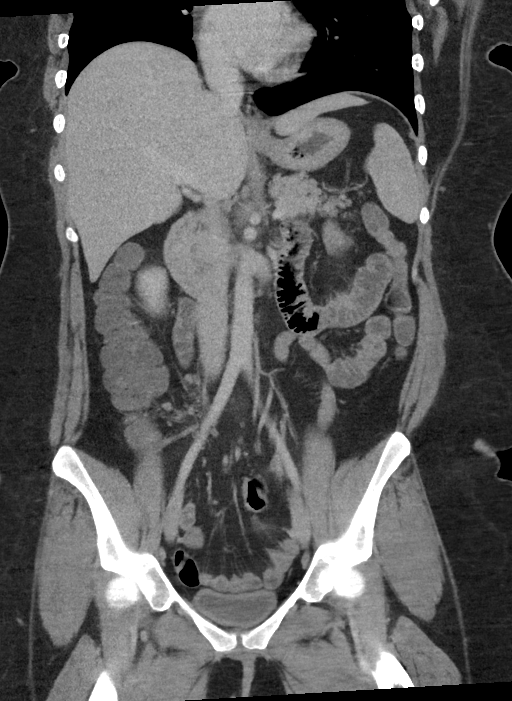
[im 54/98  soft-tissue]
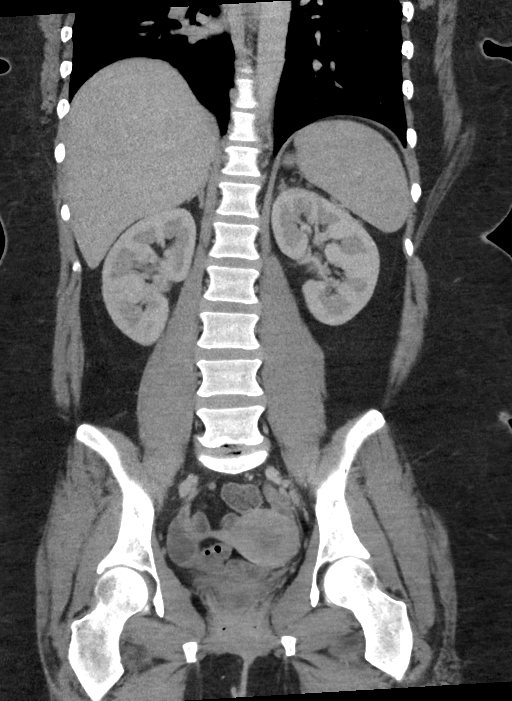

[16 of 46 positions shown; findings below may reference images not displayed]

FINDINGS: Lower chest: The visualized lung bases are clear.

No intra-abdominal free air or free fluid.

Hepatobiliary: No focal liver abnormality is seen. No gallstones,
gallbladder wall thickening, or biliary dilatation.

Pancreas: Unremarkable. No pancreatic ductal dilatation or
surrounding inflammatory changes.

Spleen: Normal in size without focal abnormality.

Adrenals/Urinary Tract: Adrenal glands are unremarkable. Kidneys are
normal, without renal calculi, focal lesion, or hydronephrosis.
Bladder is unremarkable.

Stomach/Bowel: Liquid stool noted throughout the colon compatible
with diarrheal state. Correlation with clinical exam and stool
cultures recommended. There is no evidence of bowel obstruction or
active inflammation. Normal appendix.

Vascular/Lymphatic: No significant vascular findings are present. No
enlarged abdominal or pelvic lymph nodes.

Reproductive: The uterus is anteverted and grossly unremarkable.
There is a 2.5 cm right ovarian dominant follicle/cyst. The left
ovary is unremarkable.

Other: None

Musculoskeletal: Grade 1 L5-S1 retrolisthesis. Disc disease and
vacuum phenomena at L5-S1. No acute or significant osseous findings.
IMPRESSION: Diarrheal state. Correlation with clinical exam and stool cultures
recommended. No bowel obstruction or active inflammation. Normal
appendix.

## 2018-03-24 ENCOUNTER — Ambulatory Visit: Payer: Managed Care, Other (non HMO)

## 2018-03-31 ENCOUNTER — Encounter (INDEPENDENT_AMBULATORY_CARE_PROVIDER_SITE_OTHER): Payer: Self-pay

## 2018-03-31 ENCOUNTER — Ambulatory Visit: Payer: Self-pay | Attending: Oncology

## 2018-03-31 ENCOUNTER — Ambulatory Visit (INDEPENDENT_AMBULATORY_CARE_PROVIDER_SITE_OTHER): Payer: Self-pay | Admitting: Obstetrics and Gynecology

## 2018-03-31 ENCOUNTER — Other Ambulatory Visit (HOSPITAL_COMMUNITY)
Admission: RE | Admit: 2018-03-31 | Discharge: 2018-03-31 | Disposition: A | Discharge status: Home / Self Care | Payer: Medicaid Other | Source: Ambulatory Visit | Attending: Obstetrics and Gynecology | Admitting: Obstetrics and Gynecology

## 2018-03-31 ENCOUNTER — Encounter: Payer: Self-pay | Admitting: Obstetrics and Gynecology

## 2018-03-31 VITALS — BP 138/91 | HR 85 | Temp 98.6°F | Ht 68.0 in | Wt 331.0 lb

## 2018-03-31 VITALS — BP 130/80 | Ht 68.0 in | Wt 335.0 lb

## 2018-03-31 DIAGNOSIS — N870 Mild cervical dysplasia: Secondary | ICD-10-CM

## 2018-03-31 DIAGNOSIS — R87810 Cervical high risk human papillomavirus (HPV) DNA test positive: Secondary | ICD-10-CM | POA: Diagnosis present

## 2018-03-31 DIAGNOSIS — R87610 Atypical squamous cells of undetermined significance on cytologic smear of cervix (ASC-US): Secondary | ICD-10-CM | POA: Diagnosis not present

## 2018-03-31 DIAGNOSIS — R87618 Other abnormal cytological findings on specimens from cervix uteri: Secondary | ICD-10-CM

## 2018-03-31 DIAGNOSIS — R8789 Other abnormal findings in specimens from female genital organs: Secondary | ICD-10-CM

## 2018-03-31 NOTE — Progress Notes (Signed)
   GYNECOLOGY CLINIC COLPOSCOPY PROCEDURE NOTE  32 y.o. [id number] here for colposcopy for ASCUS with POSITIVE high risk HPV  pap smear on 03/13/18. Discussed underlying role for HPV infection in the development of cervical dysplasia, its natural history and progression/regression, need for surveillance.  Is the patient  pregnant: No LMP: No LMP recorded. Patient has had an implant. Smoking status:  reports that she has never smoked. She has never used smokeless tobacco. Contraception: Nexplanon Future fertility desired: unsure, has 3 children'  Patient given informed consent, signed copy in the chart, time out was performed.  The patient was position in dorsal lithotomy position. Speculum was placed the cervix was visualized.   After application of acetic acid colposcopic inspection of the cervix was undertaken.   Colposcopy adequate, full visualization of transformation zone: Yes acetowhite lesion(s) noted from 9 to 3 o'clock; corresponding biopsies obtained.   ECC specimen obtained:  Yes  All specimens were labeled and sent to pathology.   Patient was given post procedure instructions.  Will follow up pathology and manage accordingly.  Routine preventative health maintenance measures emphasized.  Physical Exam  Genitourinary:      Adelene Idlerhristanna Aidyn Sportsman MD [facility], [facility] 03/31/18 3:15 PM

## 2018-03-31 NOTE — Progress Notes (Signed)
  Subjective:     Patient ID: Patricia Brock, female   DOB: Aug 15, 1985, 32 y.o.   MRN: [id number]  HPI   Review of Systems     Objective:   Physical Exam     Assessment:     32 [facility]  Patient screened, and meets [facility] eligibility.  Patient does not have insurance, Medicare or Medicaid.  Handout given on Affordable Care Act.  Patient had a pap at [facility] with ASCUS/HPV positive results on 02/26/18.  Reviewed results with patient.  She is [facility].      Plan:     Will follow per Dr. Jerene PitchSchuman recommendation, and BCCCP guidelines.

## 2018-04-07 NOTE — Telephone Encounter (Signed)
Result signed Friday. I need to clarify the report with pathology. I have sent a message to the patient regarding this and I am awaiting pathology's response.  Thank you, Dr. Jerene PitchSchuman

## 2018-04-07 NOTE — Telephone Encounter (Signed)
Pt states she had a biopsy done last Monday. She is inquiring how long it takes to get the results back. [phone]#[phone]

## 2018-04-07 NOTE — Telephone Encounter (Signed)
Spoke w/pt. Notified results are back but have not been reviewed by Dr. Jerene PitchSchuman. She will not be back in the office until Wed & she will be the one to contact pt with results. Results can not be released until reviewed by provider. Pt voices understanding & will wait to here from Dr. Jerene PitchSchuman.

## 2018-04-08 NOTE — Progress Notes (Signed)
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 Endocervical biopsy shows possible high grade lesion, but not conclusive. Spoke with Pathologist directly about this on the phone as well. Given the uncertainty of the report will repeat colposcopy in 2-3 months.

## 2018-04-09 ENCOUNTER — Telehealth: Payer: Self-pay | Admitting: Obstetrics and Gynecology

## 2018-04-09 NOTE — Telephone Encounter (Signed)
-----   Message from Natale Milchhristanna R Schuman, MD sent at 04/08/2018  4:56 PM EST ----- Please call patient and schedule her for a colposcopy in 2-3 months.  Thank you,  Dr. Jerene PitchSchuman

## 2018-04-09 NOTE — Telephone Encounter (Signed)
Patient is schedule 05/20/17 with Dr. Jerene PitchSchuman. Lvm for Christy with BCCCP program about patient returning for additional Colpo and need to know if patient needs to see her first or not .

## 2018-05-20 ENCOUNTER — Encounter: Payer: Self-pay | Admitting: Emergency Medicine

## 2018-05-20 ENCOUNTER — Emergency Department
Admission: EM | Admit: 2018-05-20 | Discharge: 2018-05-20 | Disposition: A | Discharge status: Home / Self Care | Payer: Medicaid Other | Attending: Emergency Medicine | Admitting: Emergency Medicine

## 2018-05-20 ENCOUNTER — Other Ambulatory Visit: Payer: Self-pay

## 2018-05-20 ENCOUNTER — Ambulatory Visit (INDEPENDENT_AMBULATORY_CARE_PROVIDER_SITE_OTHER): Payer: Self-pay | Admitting: Obstetrics and Gynecology

## 2018-05-20 ENCOUNTER — Other Ambulatory Visit (HOSPITAL_COMMUNITY)
Admission: RE | Admit: 2018-05-20 | Discharge: 2018-05-20 | Disposition: A | Discharge status: Home / Self Care | Payer: Medicaid Other | Source: Ambulatory Visit | Attending: Obstetrics and Gynecology | Admitting: Obstetrics and Gynecology

## 2018-05-20 ENCOUNTER — Encounter: Payer: Self-pay | Admitting: Obstetrics and Gynecology

## 2018-05-20 ENCOUNTER — Emergency Department: Payer: Medicaid Other

## 2018-05-20 VITALS — BP 118/80 | Ht 68.0 in | Wt 339.0 lb

## 2018-05-20 DIAGNOSIS — R87610 Atypical squamous cells of undetermined significance on cytologic smear of cervix (ASC-US): Secondary | ICD-10-CM | POA: Diagnosis present

## 2018-05-20 DIAGNOSIS — R1031 Right lower quadrant pain: Secondary | ICD-10-CM | POA: Diagnosis present

## 2018-05-20 DIAGNOSIS — Z79899 Other long term (current) drug therapy: Secondary | ICD-10-CM | POA: Diagnosis not present

## 2018-05-20 DIAGNOSIS — J45909 Unspecified asthma, uncomplicated: Secondary | ICD-10-CM | POA: Diagnosis not present

## 2018-05-20 DIAGNOSIS — R87810 Cervical high risk human papillomavirus (HPV) DNA test positive: Secondary | ICD-10-CM | POA: Diagnosis present

## 2018-05-20 DIAGNOSIS — M545 Low back pain, unspecified: Secondary | ICD-10-CM

## 2018-05-20 DIAGNOSIS — M62830 Muscle spasm of back: Secondary | ICD-10-CM

## 2018-05-20 LAB — URINALYSIS, COMPLETE (UACMP) WITH MICROSCOPIC
BACTERIA UA: NONE SEEN
Glucose, UA: NEGATIVE mg/dL
Ketones, ur: 5 mg/dL — AB
Leukocytes, UA: NEGATIVE
Nitrite: NEGATIVE
Protein, ur: NEGATIVE mg/dL
SPECIFIC GRAVITY, URINE: 1.015 (ref 1.005–1.030)
pH: 6 (ref 5.0–8.0)

## 2018-05-20 LAB — COMPREHENSIVE METABOLIC PANEL
ALK PHOS: 53 U/L (ref 38–126)
ALT: 16 U/L (ref 0–44)
AST: 21 U/L (ref 15–41)
Albumin: 3.7 g/dL (ref 3.5–5.0)
Anion gap: 6 (ref 5–15)
BUN: 6 mg/dL (ref 6–20)
CALCIUM: 8.1 mg/dL — AB (ref 8.9–10.3)
CHLORIDE: 110 mmol/L (ref 98–111)
CO2: 24 mmol/L (ref 22–32)
CREATININE: 0.73 mg/dL (ref 0.44–1.00)
Glucose, Bld: 91 mg/dL (ref 70–99)
Potassium: 3.7 mmol/L (ref 3.5–5.1)
SODIUM: 140 mmol/L (ref 135–145)
Total Bilirubin: 0.7 mg/dL (ref 0.3–1.2)
Total Protein: 7.2 g/dL (ref 6.5–8.1)

## 2018-05-20 LAB — CBC
HCT: 39.2 % (ref 36.0–46.0)
HEMOGLOBIN: 12.3 g/dL (ref 12.0–15.0)
MCH: 26.7 pg (ref 26.0–34.0)
MCHC: 31.4 g/dL (ref 30.0–36.0)
MCV: 85 fL (ref 80.0–100.0)
NRBC: 0 % (ref 0.0–0.2)
Platelets: 269 10*3/uL (ref 150–400)
RBC: 4.61 MIL/uL (ref 3.87–5.11)
RDW: 13.4 % (ref 11.5–15.5)
WBC: 8.1 10*3/uL (ref 4.0–10.5)

## 2018-05-20 LAB — POCT PREGNANCY, URINE: Preg Test, Ur: NEGATIVE

## 2018-05-20 MED ORDER — KETOROLAC TROMETHAMINE 10 MG PO TABS: 10.0000 mg | ORAL_TABLET | Freq: Four times a day (QID) | ORAL | 0 refills | Status: AC | PRN

## 2018-05-20 MED ORDER — KETOROLAC TROMETHAMINE 30 MG/ML IJ SOLN
30.0000 mg | Freq: Once | INTRAMUSCULAR | Status: AC
  Administered 2018-05-20: 30 mg via INTRAVENOUS
  Filled 2018-05-20: qty 1

## 2018-05-20 MED ORDER — CYCLOBENZAPRINE HCL 5 MG PO TABS: ORAL_TABLET | ORAL | 0 refills | Status: AC

## 2018-05-20 NOTE — Progress Notes (Signed)
   GYNECOLOGY CLINIC COLPOSCOPY PROCEDURE NOTE  33 y.o. [id number] here for colposcopy for ASCUS with POSITIVE high risk HPV  pap smear in November of 2019 and a colposcopy with CIN high grade endocervical cells. Repeat colposcopy 2 months later today . 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 Discussed underlying role for HPV infection in the development of cervical dysplasia, its natural history and progression/regression, need for surveillance.  Is the patient  pregnant: No LMP: No LMP recorded. Patient has had an implant. Smoking status:  reports that she has never smoked. She has never used smokeless tobacco.  Patient given informed consent, signed copy in the chart, time out was performed.  The patient was position in dorsal lithotomy position. Speculum was placed the cervix was visualized.   After application of acetic acid colposcopic inspection of the cervix was undertaken.   Colposcopy adequate, full visualization of transformation zone: Yes HPV changes noted at 10-2 o'clock; corresponding biopsies obtained.   ECC specimen obtained:  Yes  All specimens were labeled and sent to pathology.   Patient was given post procedure instructions.  Will follow up pathology and manage accordingly.  Routine preventative health maintenance measures emphasized.  Physical Exam Genitourinary:        Adelene Idler MD [facility], [facility] 05/20/2018 12:20 PM

## 2018-05-20 NOTE — ED Notes (Signed)
Pt signed esignature.  D/c  inst to pt.  

## 2018-05-20 NOTE — ED Notes (Addendum)
See triage note  Presents with some lower back discomfort for the past few days  Denies any injury  Ambulates well to treatment room no fever or n/v

## 2018-05-20 NOTE — ED Triage Notes (Signed)
Pt c/o lower back spams x 2wks, denies any acute pain. Denies GU symptoms. VSS

## 2018-05-20 NOTE — ED Provider Notes (Addendum)
[facility] Emergency Department Provider Note  ____________________________________________  Time seen: Approximately 12:53 PM  I have reviewed the triage vital signs and the nursing notes.   HISTORY  Chief Complaint Back Pain    HPI Patricia Brock is a 33 y.o. female presents the emergency department for evaluation of low right back pain for 1 month.  When she stands for long periods of time, occasionally she feels tingling in her lower legs.  She does not have this symptom unless she is standing for an extended period of time.  Patient states that she has been taking ibuprofen for pain, without consistent relief.  Patient sees OB/GYN for abnormal Pap smears and has regular STD testing.  No additional concern for STDs. She doesn't follow a formal exercise program but is an active person.  No bowel or bladder dysfunction or saddle anesthesias.  No trauma. No fever, nausea, vomiting, abdominal pain, vaginal discharge, dysuria, urgency, frequency, hematuria, flank pain.   Past Medical History:  Diagnosis Date  . Asthma     There are no active problems to display for this patient.   Past Surgical History:  Procedure Laterality Date  . LEEP      Prior to Admission medications   Medication Sig Start Date End Date Taking? Authorizing Provider  albuterol (PROVENTIL) (2.5 MG/3ML) 0.083% nebulizer solution Take 2.5 mg by nebulization every 6 (six) hours as needed for wheezing or shortness of breath.    [provider]  cyclobenzaprine (FLEXERIL) 5 MG tablet Take 1-2 tablets 3 times daily as needed 05/20/18   Enid Derry, PA-C  etodolac (LODINE) 200 MG capsule Take 1 capsule (200 mg total) by mouth every 8 (eight) hours. Patient not taking: Reported on 03/31/2018 11/12/16   Rebecka Apley, MD  ibuprofen (ADVIL,MOTRIN) 200 MG tablet Take 200 mg by mouth every 6 (six) hours as needed.    [provider]  ketorolac (TORADOL) 10 MG tablet Take 1  tablet (10 mg total) by mouth every 6 (six) hours as needed. 05/20/18   Enid Derry, PA-C    Allergies Patient has no known allergies.  No family history on file.  Social History Social History   Tobacco Use  . Smoking status: Never Smoker  . Smokeless tobacco: Never Used  Substance Use Topics  . Alcohol use: No  . Drug use: No     Review of Systems  Constitutional: No fever/chills Cardiovascular: No chest pain. Respiratory: No SOB. Gastrointestinal: No abdominal pain.  No nausea, no vomiting.  Musculoskeletal: Positive for back pain. Skin: Negative for rash, abrasions, lacerations, ecchymosis. Neurological: Negative for headaches   ____________________________________________   PHYSICAL EXAM:  VITAL SIGNS: ED Triage Vitals [05/20/18 1140]  Enc Vitals Group     BP 134/77     Pulse Rate 84     Resp 16     Temp 98.2 F (36.8 C)     Temp Source Oral     SpO2 99 %     Weight      Height      Head Circumference      Peak Flow      Pain Score 7     Pain Loc      Pain Edu?      Excl. in GC?      Constitutional: Alert and oriented. Well appearing and in no acute distress. Eyes: Conjunctivae are normal. PERRL. EOMI. Head: Atraumatic. ENT:      Ears:  Nose: No congestion/rhinnorhea.      Mouth/Throat: Mucous membranes are moist.  Neck: No stridor.   Cardiovascular: Normal rate, regular rhythm.  Good peripheral circulation. Respiratory: Normal respiratory effort without tachypnea or retractions. Lungs CTAB. Good air entry to the bases with no decreased or absent breath sounds. Gastrointestinal: Bowel sounds 4 quadrants. Soft and nontender to palpation. No guarding or rigidity. No palpable masses. No distention.  No CVA tenderness. Musculoskeletal: Full range of motion to all extremities. No gross deformities appreciated.  No tenderness to palpation to lumbar spine and left lumbar paraspinal muscles.  Tenderness to palpation to inferior right lumbar and  superior right buttocks.  Strength equal in lower extremities bilaterally.  Normal gait.  No foot drop. Neurologic:  Normal speech and language. No gross focal neurologic deficits are appreciated.  Skin:  Skin is warm, dry and intact. No rash noted. Psychiatric: Mood and affect are normal. Speech and behavior are normal. Patient exhibits appropriate insight and judgement.   ____________________________________________   LABS (all labs ordered are listed, but only abnormal results are displayed)  Labs Reviewed  URINALYSIS, COMPLETE (UACMP) WITH MICROSCOPIC - Abnormal; Notable for the following components:      Result Value   Hgb urine dipstick MODERATE (*)    Bilirubin Urine LARGE (*)    Ketones, ur 5 (*)    All other components within normal limits  COMPREHENSIVE METABOLIC PANEL - Abnormal; Notable for the following components:   Calcium 8.1 (*)    All other components within normal limits  CBC  HEPATITIS PANEL, ACUTE  POC URINE PREG, ED  POCT PREGNANCY, URINE   ____________________________________________  EKG   ____________________________________________  RADIOLOGY Lexine BatonI, Varick Keys, personally viewed and evaluated these images (plain radiographs) as part of my medical decision making, as well as reviewing the written report by the radiologist.  Dg Lumbar Spine 2-3 Views  Result Date: 05/20/2018 CLINICAL DATA:  Low back pain for 2 weeks, no injury EXAM: LUMBAR SPINE - 2-3 VIEW COMPARISON:  CT abdomen pelvis of 11/12/2016 FINDINGS: The lumbar vertebrae are in normal alignment. Very mild degenerative disc disease is present at L5-S1 where there is slightly decreased disc space. The remainder of disc spaces appear normal. No compression deformity is seen. The SI joints appear well corticated. IMPRESSION: No change in alignment with mild degenerative disc disease at L5-S1. Electronically Signed   By: Dwyane DeePaul  Barry M.D.   On: 05/20/2018 13:36     ____________________________________________    PROCEDURES  Procedure(s) performed:    Procedures    Medications  ketorolac (TORADOL) 30 MG/ML injection 30 mg (30 mg Intravenous Given 05/20/18 1549)     ____________________________________________   INITIAL IMPRESSION / ASSESSMENT AND PLAN / ED COURSE  Pertinent labs & imaging results that were available during my care of the patient were reviewed by me and considered in my medical decision making (see chart for details).  Review of the [place] CSRS was performed in accordance of the [facility] prior to dispensing any controlled drugs.  ----------------------------------------- 2:30 PM on 05/20/2018 -----------------------------------------  Patient has hemoglobin and bilirubin in her urine.  Patient denies any history of liver disease or kidney stones.  Blood work was ordered to further evaluate.   Patient's diagnosis is consistent with muscle spasm.  Vital signs and exam are reassuring.  X-ray consistent with arthritis.  Urinalysis is negative for infection.  Urinalysis shows some bilirubin and hemoglobin and patient will follow-up with primary care for recheck.  Her liver function is  within normal limits.  She is not having any abdominal pain.  Hepatitis panel was ordered and in process.  Patient has never had any liver disease.  Patient also denies any history of a kidney stone and does not have any symptoms consistent with kidney stone.  Patient's pain is much lower than flank pain.  She will be treated for muscle spasms.  She is agreeable to make an appointment with primary care.  Patient will be discharged home with prescriptions for Flexeril and Toradol. Patient is to follow up with primary care as directed. Patient is given ED precautions to return to the ED for any worsening or new symptoms.     ____________________________________________  FINAL CLINICAL IMPRESSION(S) / ED DIAGNOSES  Final diagnoses:  Acute  right-sided low back pain without sciatica  Muscle spasm of back      NEW MEDICATIONS STARTED DURING THIS VISIT:  ED Discharge Orders         Ordered    cyclobenzaprine (FLEXERIL) 5 MG tablet     05/20/18 1535    ketorolac (TORADOL) 10 MG tablet  Every 6 hours PRN     05/20/18 1535              This chart was dictated using voice recognition software/Dragon. Despite best efforts to proofread, errors can occur which can change the meaning. Any change was purely unintentional.    Enid DerryWagner, Hazyl Marseille, PA-C 05/20/18 1613    Enid DerryWagner, Danila Eddie, PA-C 05/20/18 1614    Minna AntisPaduchowski, Kevin, MD 05/21/18 2153

## 2018-05-20 NOTE — Discharge Instructions (Addendum)
Your exam is consistent with muscle spasms in your low back.  Your back x-ray shows some arthritis in your back.  Your urine does not show any signs of infection.  Your urine does show some bilirubin and blood and should be rechecked in 1 week by primary care.  Your blood work is reassuring and your kidney and liver functions are normal.  Your hepatitis panel is still in progress.  Please make an appointment with primary care at the end of the week or beginning of next week. Return to the emergency department for any change or worsening of symptoms.

## 2018-05-20 NOTE — ED Notes (Signed)
Gave pt urine cup with bag. Waiting on sample.

## 2018-05-21 LAB — HEPATITIS PANEL, ACUTE
Hep A IgM: NEGATIVE
Hep B C IgM: NEGATIVE
Hepatitis B Surface Ag: NEGATIVE

## 2018-05-26 NOTE — Progress Notes (Signed)
Called pathology to discuss the result. They will perform p16 staining

## 2018-05-27 NOTE — Progress Notes (Signed)
CIN1- repeat pap smear in 1 year

## 2018-06-01 NOTE — Progress Notes (Signed)
Per Dr. Ellie Lunch request, patient scheduled for 1 year follow-up pap in [facility].  Appointment scheduled for 05/20/19 at 2:30.  Mailed appointment information to patient, and notified Dr. Jerene Pitch.  Copy to [facility].

## 2019-02-13 ENCOUNTER — Ambulatory Visit: Payer: Self-pay

## 2019-02-13 ENCOUNTER — Other Ambulatory Visit: Payer: Self-pay

## 2019-02-13 ENCOUNTER — Ambulatory Visit (LOCAL_COMMUNITY_HEALTH_CENTER): Payer: Medicaid Other | Admitting: Advanced Practice Midwife

## 2019-02-13 VITALS — BP 161/97 | Ht 67.0 in | Wt 360.6 lb

## 2019-02-13 DIAGNOSIS — Z3009 Encounter for other general counseling and advice on contraception: Secondary | ICD-10-CM

## 2019-02-13 DIAGNOSIS — Z30017 Encounter for initial prescription of implantable subdermal contraceptive: Secondary | ICD-10-CM | POA: Diagnosis not present

## 2019-02-13 DIAGNOSIS — R030 Elevated blood-pressure reading, without diagnosis of hypertension: Secondary | ICD-10-CM

## 2019-02-13 DIAGNOSIS — Z3046 Encounter for surveillance of implantable subdermal contraceptive: Secondary | ICD-10-CM

## 2019-02-13 DIAGNOSIS — J45909 Unspecified asthma, uncomplicated: Secondary | ICD-10-CM

## 2019-02-13 MED ORDER — ETONOGESTREL 68 MG ~~LOC~~ IMPL
68.0000 mg | DRUG_IMPLANT | Freq: Once | SUBCUTANEOUS | Status: AC
Start: 2019-02-13 — End: 2019-02-13
  Administered 2019-02-13: 68 mg via SUBCUTANEOUS

## 2019-02-13 NOTE — Progress Notes (Addendum)
   [place] problem visit  [place] Department  Subjective:  Dorisann Schwanke is a 33 y.o.G4P3 nonsmoker being seen today for Nexplanon removal/reinsertion  Chief Complaint  Patient presents with  . Procedure    Nexplanon removal/reinsertion    HPI LMP none.  Last sex 01/30/19 with condom.  Nexplanon inserted 08/2015  Does the patient have a current or past history of drug use? No   No components found for: HCV]   Health Maintenance Due  Topic Date Due  . HIV Screening  01/04/1999  . TETANUS/TDAP  01/04/2003  . INFLUENZA VACCINE  12/06/2018    ROS  The following portions of the patient's history were reviewed and updated as appropriate: allergies, current medications, past family history, past medical history, past social history, past surgical history and problem list. Problem list updated.   See flowsheet for other program required questions.  Objective:   Vitals:   02/13/19 1145  BP: (!) 161/97  Weight: (!) 360 lb 9.6 oz (163.6 kg)  Height: 5\' 7"  (1.702 m)    Physical Exam    Assessment and Plan:  Adelynne Joerger is a 33 y.o. female presenting to the [facility] Department for a Women's Health problem visit  1. Family planning   2. Encounter for surveillance of implantable subdermal contraceptive Difficult Nexplanon removal with assistance of C.H.  3. Morbid obesity ([place]) BMI=56.4 (360 lbs) 360 lbs BMI=56.4  4. Elevated BP without diagnosis of hypertension 161/97 with repeat 162/101;  Referred to primary care MD     Return if symptoms worsen or fail to improve.  Future Appointments  Date Time Provider [place]  05/20/2019  2:30 PM [facility] CL[facility]IC [facility] None    Herbie Saxon, CNM

## 2019-02-13 NOTE — Progress Notes (Signed)
Here today for a Nexplanon removal and reinsertion. Nexplanon placed here at [facility] 09/02/2015. Hal Morales, RN

## 2019-02-13 NOTE — Addendum Note (Signed)
Addended by: Jenetta Downer on: 02/13/2019 02:43 PM   Modules accepted: Orders

## 2019-02-13 NOTE — Progress Notes (Addendum)
Patient given BP's and counseled to follow-up with primary provider. Repeat BP 162/101, reviewed by provider. Patient states understanding.Jenetta Downer, RN

## 2019-05-20 ENCOUNTER — Ambulatory Visit: Payer: Medicaid Other

## 2019-12-28 ENCOUNTER — Encounter: Payer: Self-pay | Admitting: Emergency Medicine

## 2019-12-28 ENCOUNTER — Emergency Department
Admission: EM | Admit: 2019-12-28 | Discharge: 2019-12-28 | Disposition: A | Discharge status: Home / Self Care | Payer: Medicaid Other | Attending: Student in an Organized Health Care Education/Training Program | Admitting: Student in an Organized Health Care Education/Training Program

## 2019-12-28 ENCOUNTER — Emergency Department: Payer: Medicaid Other

## 2019-12-28 ENCOUNTER — Other Ambulatory Visit: Payer: Self-pay

## 2019-12-28 DIAGNOSIS — R030 Elevated blood-pressure reading, without diagnosis of hypertension: Secondary | ICD-10-CM | POA: Insufficient documentation

## 2019-12-28 DIAGNOSIS — J45909 Unspecified asthma, uncomplicated: Secondary | ICD-10-CM | POA: Diagnosis not present

## 2019-12-28 DIAGNOSIS — Z79899 Other long term (current) drug therapy: Secondary | ICD-10-CM | POA: Diagnosis not present

## 2019-12-28 DIAGNOSIS — M549 Dorsalgia, unspecified: Secondary | ICD-10-CM | POA: Diagnosis not present

## 2019-12-28 DIAGNOSIS — M79662 Pain in left lower leg: Secondary | ICD-10-CM | POA: Insufficient documentation

## 2019-12-28 DIAGNOSIS — R202 Paresthesia of skin: Secondary | ICD-10-CM | POA: Diagnosis not present

## 2019-12-28 DIAGNOSIS — M79605 Pain in left leg: Secondary | ICD-10-CM

## 2019-12-28 LAB — URINALYSIS, COMPLETE (UACMP) WITH MICROSCOPIC
Bacteria, UA: NONE SEEN
Bilirubin Urine: NEGATIVE
Glucose, UA: NEGATIVE mg/dL
Hgb urine dipstick: NEGATIVE
Ketones, ur: NEGATIVE mg/dL
Leukocytes,Ua: NEGATIVE
Nitrite: NEGATIVE
Protein, ur: NEGATIVE mg/dL
Specific Gravity, Urine: 1.019 (ref 1.005–1.030)
pH: 5 (ref 5.0–8.0)

## 2019-12-28 LAB — GLUCOSE, CAPILLARY: Glucose-Capillary: 66 mg/dL — ABNORMAL LOW (ref 70–99)

## 2019-12-28 LAB — PREGNANCY, URINE: Preg Test, Ur: NEGATIVE

## 2019-12-28 MED ORDER — IBUPROFEN 600 MG PO TABS: 600.0000 mg | ORAL_TABLET | Freq: Four times a day (QID) | ORAL | 0 refills | Status: AC | PRN

## 2019-12-28 MED ORDER — BACLOFEN 5 MG PO TABS: 5.0000 mg | ORAL_TABLET | Freq: Three times a day (TID) | ORAL | 0 refills | Status: AC | PRN

## 2019-12-28 NOTE — ED Notes (Signed)
Pt came to desk stating that she could not wait any longer and had to go and pick up her son. Pt states she had to go and did not stay for discharge vitals. Discharge paperwork given and instructions explained. Pt voices understanding, ambulatory from room

## 2019-12-28 NOTE — ED Provider Notes (Signed)
[facility] Emergency Department Provider Note  ____________________________________________  Time seen: Approximately 12:21 PM  I have reviewed the triage vital signs and the nursing notes.   HISTORY  Chief Complaint Leg Pain and Foot Pain    HPI Patricia Brock is a 34 y.o. female that presents to the emergency department for evaluation of left leg pain and numbness for 2 days.  Patient states that pain and numbness starts at the bottom of her foot and shoots up her leg to her knee.  She did have back pain 2 days ago but this has improved.  Her back is not bothering her currently but will sometimes give out on her.  No trauma.  No headache, shortness breath, chest pain, nausea, vomiting, abdominal pain, urinary symptoms, weakness.  Past Medical History:  Diagnosis Date  . Asthma     Patient Active Problem List   Diagnosis Date Noted  . Morbid obesity (HCC) BMI=56.4 (360 lbs) 02/13/2019  . Elevated BP without diagnosis of hypertension 161/97, 162/101 02/13/2019  . Asthma 02/13/2019    Past Surgical History:  Procedure Laterality Date  . LEEP      Prior to Admission medications   Medication Sig Start Date End Date Taking? Authorizing Provider  albuterol (PROVENTIL) (2.5 MG/3ML) 0.083% nebulizer solution Take 2.5 mg by nebulization every 6 (six) hours as needed for wheezing or shortness of breath.    [provider]  Baclofen 5 MG TABS Take 5 mg by mouth 3 (three) times daily as needed. 12/28/19   Enid Derry, PA-C  cyclobenzaprine (FLEXERIL) 5 MG tablet Take 1-2 tablets 3 times daily as needed Patient not taking: Reported on 02/13/2019 05/20/18   Enid Derry, PA-C  etodolac (LODINE) 200 MG capsule Take 1 capsule (200 mg total) by mouth every 8 (eight) hours. Patient not taking: Reported on 03/31/2018 11/12/16   Rebecka Apley, MD  ibuprofen (ADVIL) 600 MG tablet Take 1 tablet (600 mg total) by mouth every 6 (six) hours as needed. 12/28/19    Enid Derry, PA-C  ketorolac (TORADOL) 10 MG tablet Take 1 tablet (10 mg total) by mouth every 6 (six) hours as needed. Patient not taking: Reported on 02/13/2019 05/20/18   Enid Derry, PA-C    Allergies Patient has no known allergies.  No family history on file.  Social History Social History   Tobacco Use  . Smoking status: Never Smoker  . Smokeless tobacco: Never Used  Vaping Use  . Vaping Use: Never used  Substance Use Topics  . Alcohol use: No  . Drug use: No     Review of Systems  Cardiovascular: No chest pain. Respiratory:  No SOB. Gastrointestinal: No abdominal pain.  No nausea, no vomiting.  Musculoskeletal: Positive for leg pain. Skin: Negative for rash, abrasions, lacerations, ecchymosis. Neurological: Negative for headaches, tingling. Positive for numbness.   ____________________________________________   PHYSICAL EXAM:  VITAL SIGNS: ED Triage Vitals [12/28/19 1034]  Enc Vitals Group     BP      Pulse      Resp      Temp      Temp src      SpO2      Weight (!) 360 lb (163.3 kg)     Height 5\' 7"  (1.702 m)     Head Circumference      Peak Flow      Pain Score 8     Pain Loc      Pain Edu?  Excl. in GC?      Constitutional: Alert and oriented. Well appearing and in no acute distress. Eyes: Conjunctivae are normal. PERRL. EOMI. Head: Atraumatic. ENT:      Ears:      Nose: No congestion/rhinnorhea.      Mouth/Throat: Mucous membranes are moist.  Neck: No stridor.  Cardiovascular: Normal rate, regular rhythm.  Good peripheral circulation.  Symmetric pedal pulses bilaterally. Cap refill less than 3 seconds. Respiratory: Normal respiratory effort without tachypnea or retractions. Lungs CTAB. Good air entry to the bases with no decreased or absent breath sounds. Gastrointestinal: Bowel sounds 4 quadrants. Soft and nontender to palpation. No guarding or rigidity. No palpable masses. No distention.  Musculoskeletal: Full range of motion  to all extremities. No gross deformities appreciated.  Strength equal in lower extremities bilaterally.  Full ROM of toes. Normal gait. Neurologic:  Normal speech and language. No gross focal neurologic deficits are appreciated.  Skin:  Skin is warm, dry and intact.  Subjective change in sensation to left leg with light and deep touch over multiple dermatomes. Psychiatric: Mood and affect are normal. Speech and behavior are normal. Patient exhibits appropriate insight and judgement.   ____________________________________________   LABS (all labs ordered are listed, but only abnormal results are displayed)  Labs Reviewed  URINALYSIS, COMPLETE (UACMP) WITH MICROSCOPIC - Abnormal; Notable for the following components:      Result Value   Color, Urine YELLOW (*)    APPearance CLEAR (*)    All other components within normal limits  GLUCOSE, CAPILLARY - Abnormal; Notable for the following components:   Glucose-Capillary 66 (*)    All other components within normal limits  PREGNANCY, URINE  CBG MONITORING, ED   ____________________________________________  EKG   ____________________________________________  RADIOLOGY Lexine Baton, personally viewed and evaluated these images (plain radiographs) as part of my medical decision making, as well as reviewing the written report by the radiologist.  DG Lumbar Spine 2-3 Views  Result Date: 12/28/2019 CLINICAL DATA:  Chronic low back pain with left-sided radicular symptoms EXAM: LUMBAR SPINE - 2-3 VIEW COMPARISON:  May 20, 2018 FINDINGS: Standing frontal, standing lateral, and standing spot lumbosacral lateral images were obtained. There are 5 non-rib-bearing lumbar type vertebral bodies. There is no fracture or spondylolisthesis. There is moderate disc space narrowing at L5-S1. Other disc spaces appear unremarkable. There is facet osteoarthritic change at L5-S1 bilaterally. IMPRESSION: Osteoarthritic change at L5-S1. Other disc spaces  appear unremarkable. No fracture or spondylolisthesis. Electronically Signed   By: Bretta Bang III M.D.   On: 12/28/2019 14:28   US Venous Img Lower Unilateral Left  Result Date: 12/28/2019 CLINICAL DATA:  34 year old female with leg pain EXAM: LEFT LOWER EXTREMITY VENOUS DOPPLER ULTRASOUND TECHNIQUE: Gray-scale sonography with graded compression, as well as color Doppler and duplex ultrasound were performed to evaluate the lower extremity deep venous systems from the level of the common femoral vein and including the common femoral, femoral, profunda femoral, popliteal and calf veins including the posterior tibial, peroneal and gastrocnemius veins when visible. The superficial great saphenous vein was also interrogated. Spectral Doppler was utilized to evaluate flow at rest and with distal augmentation maneuvers in the common femoral, femoral and popliteal veins. COMPARISON:  None. FINDINGS: Contralateral Common Femoral Vein: Respiratory phasicity is normal and symmetric with the symptomatic side. No evidence of thrombus. Normal compressibility. Common Femoral Vein: No evidence of thrombus. Normal compressibility, respiratory phasicity and response to augmentation. Saphenofemoral Junction: No evidence of thrombus. Normal compressibility  and flow on color Doppler imaging. Profunda Femoral Vein: No evidence of thrombus. Normal compressibility and flow on color Doppler imaging. Femoral Vein: No evidence of thrombus. Normal compressibility, respiratory phasicity and response to augmentation. Popliteal Vein: No evidence of thrombus. Normal compressibility, respiratory phasicity and response to augmentation. Calf Veins: No evidence of thrombus. Normal compressibility and flow on color Doppler imaging. Superficial Great Saphenous Vein: No evidence of thrombus. Normal compressibility and flow on color Doppler imaging. Other Findings:  None. IMPRESSION: Sonographic survey of the left lower extremity negative for  DVT Electronically Signed   By: Gilmer Mor D.O.   On: 12/28/2019 13:33    ____________________________________________    PROCEDURES  Procedure(s) performed:    Procedures    Medications - No data to display   ____________________________________________   INITIAL IMPRESSION / ASSESSMENT AND PLAN / ED COURSE  Pertinent labs & imaging results that were available during my care of the patient were reviewed by me and considered in my medical decision making (see chart for details).  Review of the [place] CSRS was performed in accordance of the [employer] prior to dispensing any controlled drugs.   Patient presents to the emergency department for evaluation of left leg pain and numbness for 2 days. Lumbar x-ray shows degenerative changes at L5-S1.  Ultrasound negative for DVT.  Patient has strong pedal pulses. No weakness.  Blood sugar was 66, but patient has been in the emergency department for 5+ hours and has not eaten.  Patient likely has some lumbar radiculopathy.  No patient will be discharged home with prescriptions for Motrin and baclofen. Patient is to follow up with primary care as directed. Patient is given ED precautions to return to the ED for any worsening or new symptoms.   Patricia Brock was evaluated in Emergency Department on 12/28/2019 for the symptoms described in the history of present illness. She was evaluated in the context of the global COVID-19 pandemic, which necessitated consideration that the patient might be at risk for infection with the SARS-CoV-2 virus that causes COVID-19. Institutional protocols and algorithms that pertain to the evaluation of patients at risk for COVID-19 are in a state of rapid change based on information released by regulatory bodies including the CDC and federal and state organizations. These policies and algorithms were followed during the patient's care in the ED.  ____________________________________________  FINAL CLINICAL  IMPRESSION(S) / ED DIAGNOSES  Final diagnoses:  Pain of left lower extremity  Elevated blood pressure reading      NEW MEDICATIONS STARTED DURING THIS VISIT:  ED Discharge Orders         Ordered    ibuprofen (ADVIL) 600 MG tablet  Every 6 hours PRN        12/28/19 1452    Baclofen 5 MG TABS  3 times daily PRN        12/28/19 1452              This chart was dictated using voice recognition software/Dragon. Despite best efforts to proofread, errors can occur which can change the meaning. Any change was purely unintentional.    Enid Derry, PA-C 12/28/19 1536    Willy Eddy, MD 12/28/19 [phone]

## 2019-12-28 NOTE — Discharge Instructions (Signed)
There was no blood clot on your ultrasound.  You have some arthritis in your back on your x-ray.  Your blood pressure was elevated in the emergency department, please have primary care recheck this.

## 2019-12-28 NOTE — ED Triage Notes (Signed)
Pt reports pain to her left foot that radiates into her left knee for the last 3 days. Pt denies injuries.

## 2020-01-06 ENCOUNTER — Ambulatory Visit: Payer: Medicaid Other | Attending: Neurology

## 2020-01-13 ENCOUNTER — Other Ambulatory Visit: Payer: Self-pay

## 2020-01-13 ENCOUNTER — Ambulatory Visit: Payer: Medicaid Other | Attending: Oncology | Admitting: *Deleted

## 2020-01-13 VITALS — BP 146/103 | Temp 98.7°F | Ht 68.0 in | Wt 365.5 lb

## 2020-01-13 DIAGNOSIS — N870 Mild cervical dysplasia: Secondary | ICD-10-CM

## 2020-01-13 NOTE — Addendum Note (Signed)
Addended by: Jim Like on: 01/13/2020 11:48 AM   Modules accepted: Orders

## 2020-01-13 NOTE — Progress Notes (Signed)
Subjective:     Patient ID: Patricia Brock, female   DOB: 26-Oct-1985, 34 y.o.   MRN: [id number]  HPI   [facility] Medical History Record - 01/13/20 1119      Breast History   Screening cycle Rescreen    [facility] Date 02/13/19    Provider ([facility]) Dr. Riki Rusk    Last Mammogram Never    Recent Breast Symptoms None      Breast Cancer History   Breast Cancer History No personal or family history      Previous History of Breast Problems   Breast Surgery or Biopsy None    Breast Implants N/A    BSE Done Never      Gynecological/Obstetrical History   LMP --   absent 2nd to nexplanon   Is there any chance that the client could be pregnant?  No    Age at menarche 30    Age at menopause n/a    PAP smear history Annually    Date of last PAP  02/26/18    Provider (PAP) [facility]    Age at first live birth 32    Breast fed children Yes (type length in comments)   1 mo   DES Exposure Unkown    Cervical, Uterine or Ovarian cancer No    Family history of Cervial, Uterine or Ovarian cancer No    Hysterectomy No    Cervix removed No    Ovaries removed No    Laser/Cryosurgery Yes   leep 05/23/2016   Current method of birth control Other (see comments)   Nexplanon   Current method of Estrogen/Hormone replacement None    Smoking history None             Review of Systems     Objective:   Physical Exam Genitourinary:    Exam position: Lithotomy position.     Labia:        Right: No rash, tenderness, lesion or injury.        Left: No rash, tenderness, lesion or injury.      Urethra: No urethral pain or urethral lesion.     Vagina: No signs of injury and foreign body. Vaginal discharge present. No erythema, tenderness, bleeding, lesions or prolapsed vaginal walls.     Cervix: No cervical motion tenderness, discharge, friability, lesion, erythema, cervical bleeding or eversion.     Uterus: Not deviated, not enlarged, not fixed, not tender and no uterine prolapse.      Adnexa:        Right: No mass,  tenderness or fullness.         Left: No mass, tenderness or fullness.             Assessment:     34 year old female returns to [facility] for 1 year follow up pap smear. Patient with a history of abnormal pap smears.  Patient had a LEEP at [facility] in 2018.  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  Patient no-showed 1 year follow up pap in January of 2021, but presents today for pap.  Specimen collected for pap smear without difficulty.  Patient has been screened for eligibility.  She does not have any insurance, Medicare or Medicaid.  She also meets financial eligibility.      Plan:     Specimen for pap sent to the lab.  Will follow up per  [facility] protocol.

## 2020-01-18 ENCOUNTER — Other Ambulatory Visit: Payer: Self-pay | Admitting: *Deleted

## 2020-01-18 ENCOUNTER — Other Ambulatory Visit: Payer: Medicaid Other

## 2020-01-18 DIAGNOSIS — Z20822 Contact with and (suspected) exposure to covid-19: Secondary | ICD-10-CM

## 2020-01-19 LAB — SARS-COV-2, NAA 2 DAY TAT

## 2020-01-19 LAB — NOVEL CORONAVIRUS, NAA: SARS-CoV-2, NAA: NOT DETECTED

## 2020-01-20 LAB — IGP, APTIMA HPV: HPV Aptima: NEGATIVE

## 2020-01-25 ENCOUNTER — Encounter: Payer: Self-pay | Admitting: Advanced Practice Midwife

## 2020-01-25 DIAGNOSIS — R87619 Unspecified abnormal cytological findings in specimens from cervix uteri: Secondary | ICD-10-CM | POA: Insufficient documentation

## 2020-01-28 ENCOUNTER — Encounter: Payer: Self-pay | Admitting: *Deleted

## 2020-01-28 NOTE — Progress Notes (Signed)
Letter mailed to inform patient of her normal pap smear and need to follow up in 3 years with next pap.

## 2020-02-23 NOTE — Progress Notes (Unsigned)
Patient had repeat PAP at [facility] on 01/13/2020.  PAP was Normal and HPV was Negative.  Repeat PAP in 3 years (due 01/2023) per Arnetha Courser, CNM.  Routed 01/25/2020.  Patient was notified via mailed letter on 01/28/2020 by Molli Posey at [facility].  Hart Carwin, RN

## 2020-11-16 IMAGING — CR DG LUMBAR SPINE 2-3V
1 series · 3 of 3 positions shown · non-contrast
Comparison: May 20, 2018

CLINICAL DATA: Chronic low back pain with left-sided radicular
symptoms

EXAM:
LUMBAR SPINE - 2-3 VIEW

[Series 1: dg lumbar spine 2-3 views · 0.14mm/px · 3 of 3 slices shown]
[im 1/3]
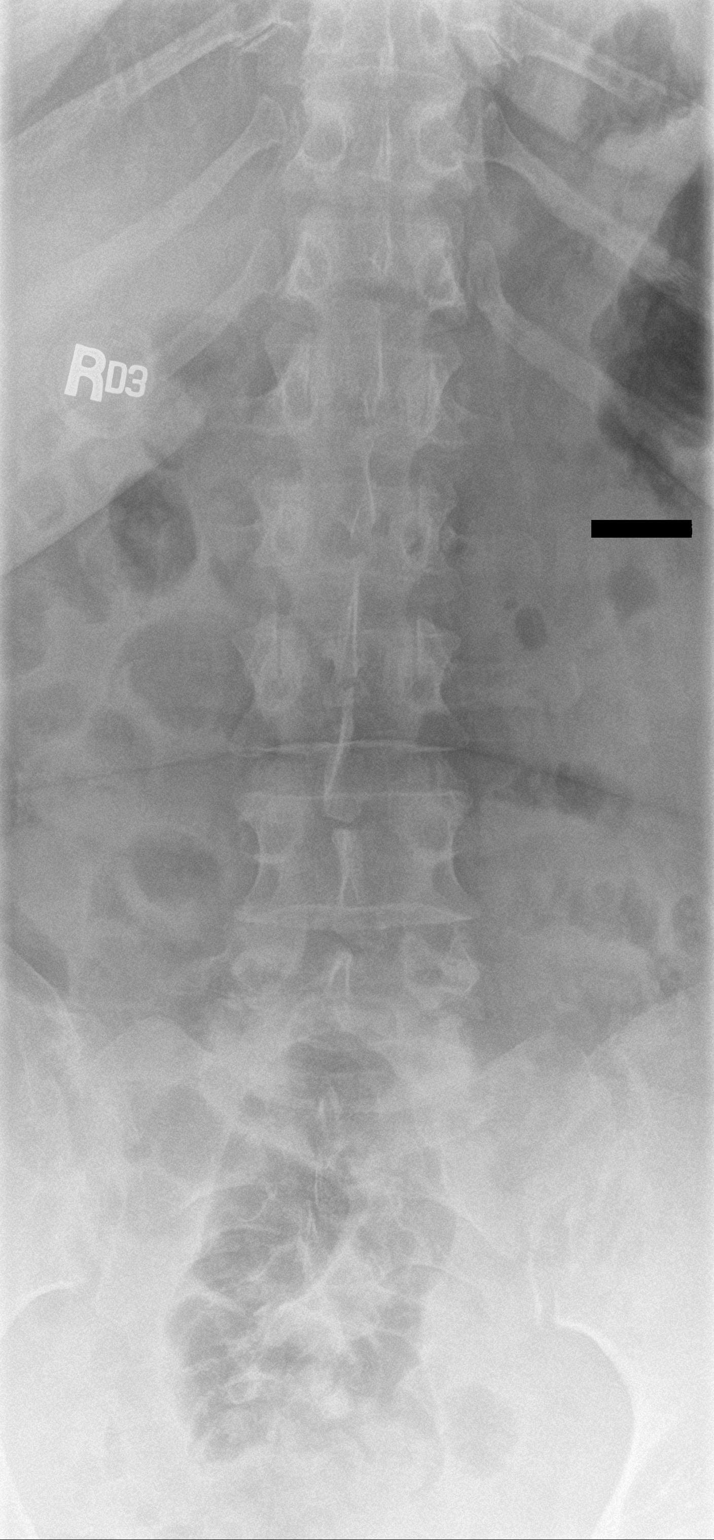
[im 2/3]
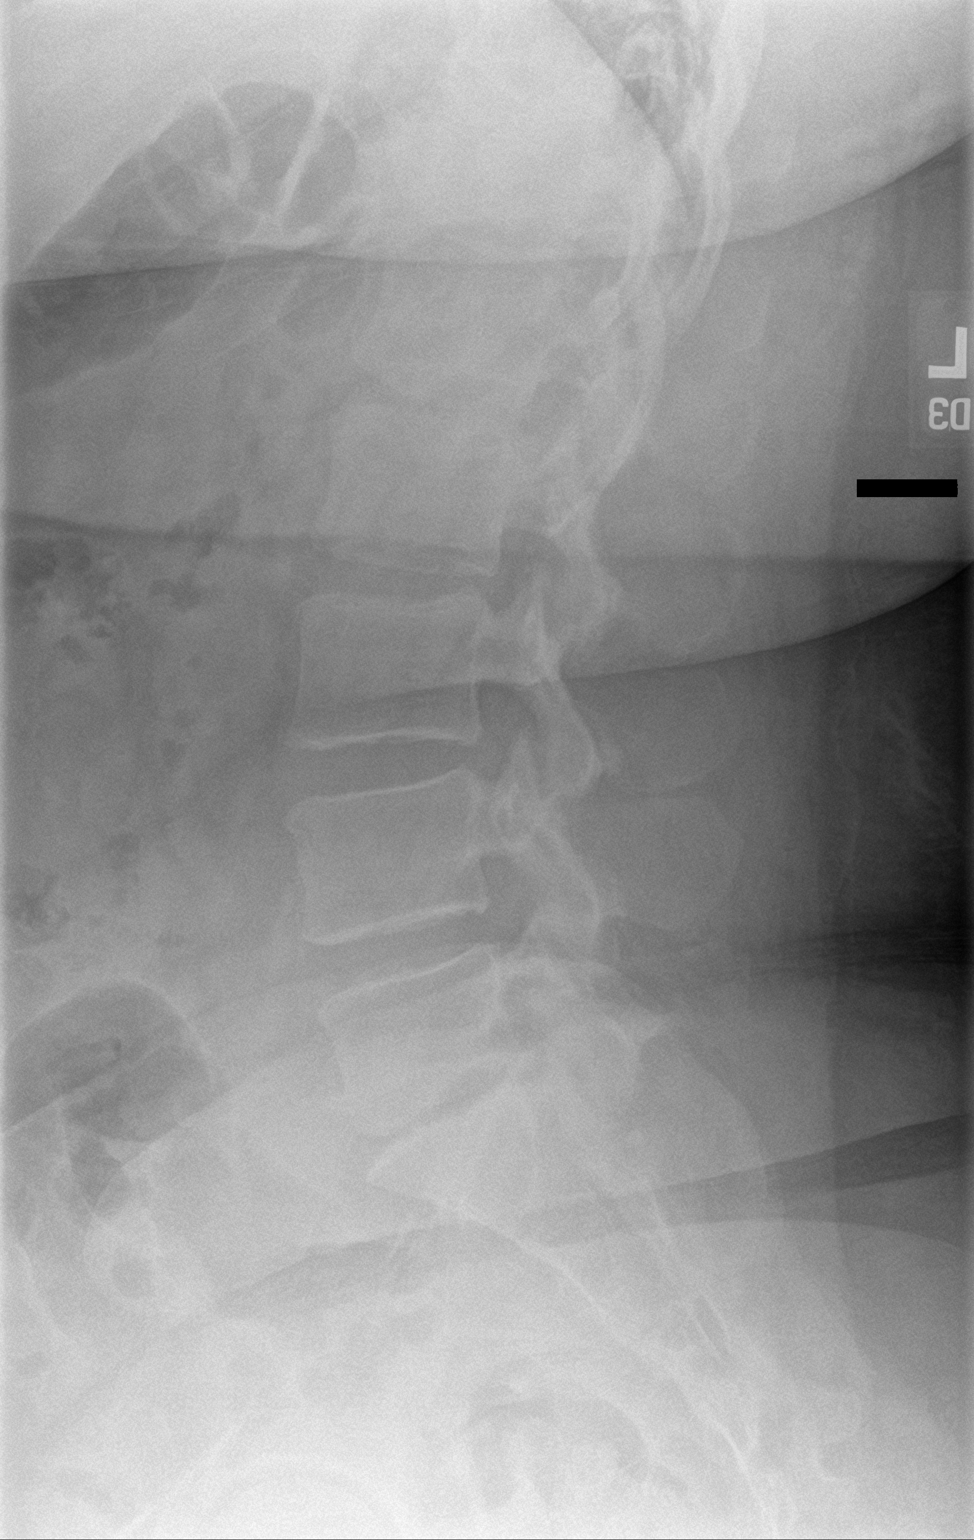
[im 3/3]
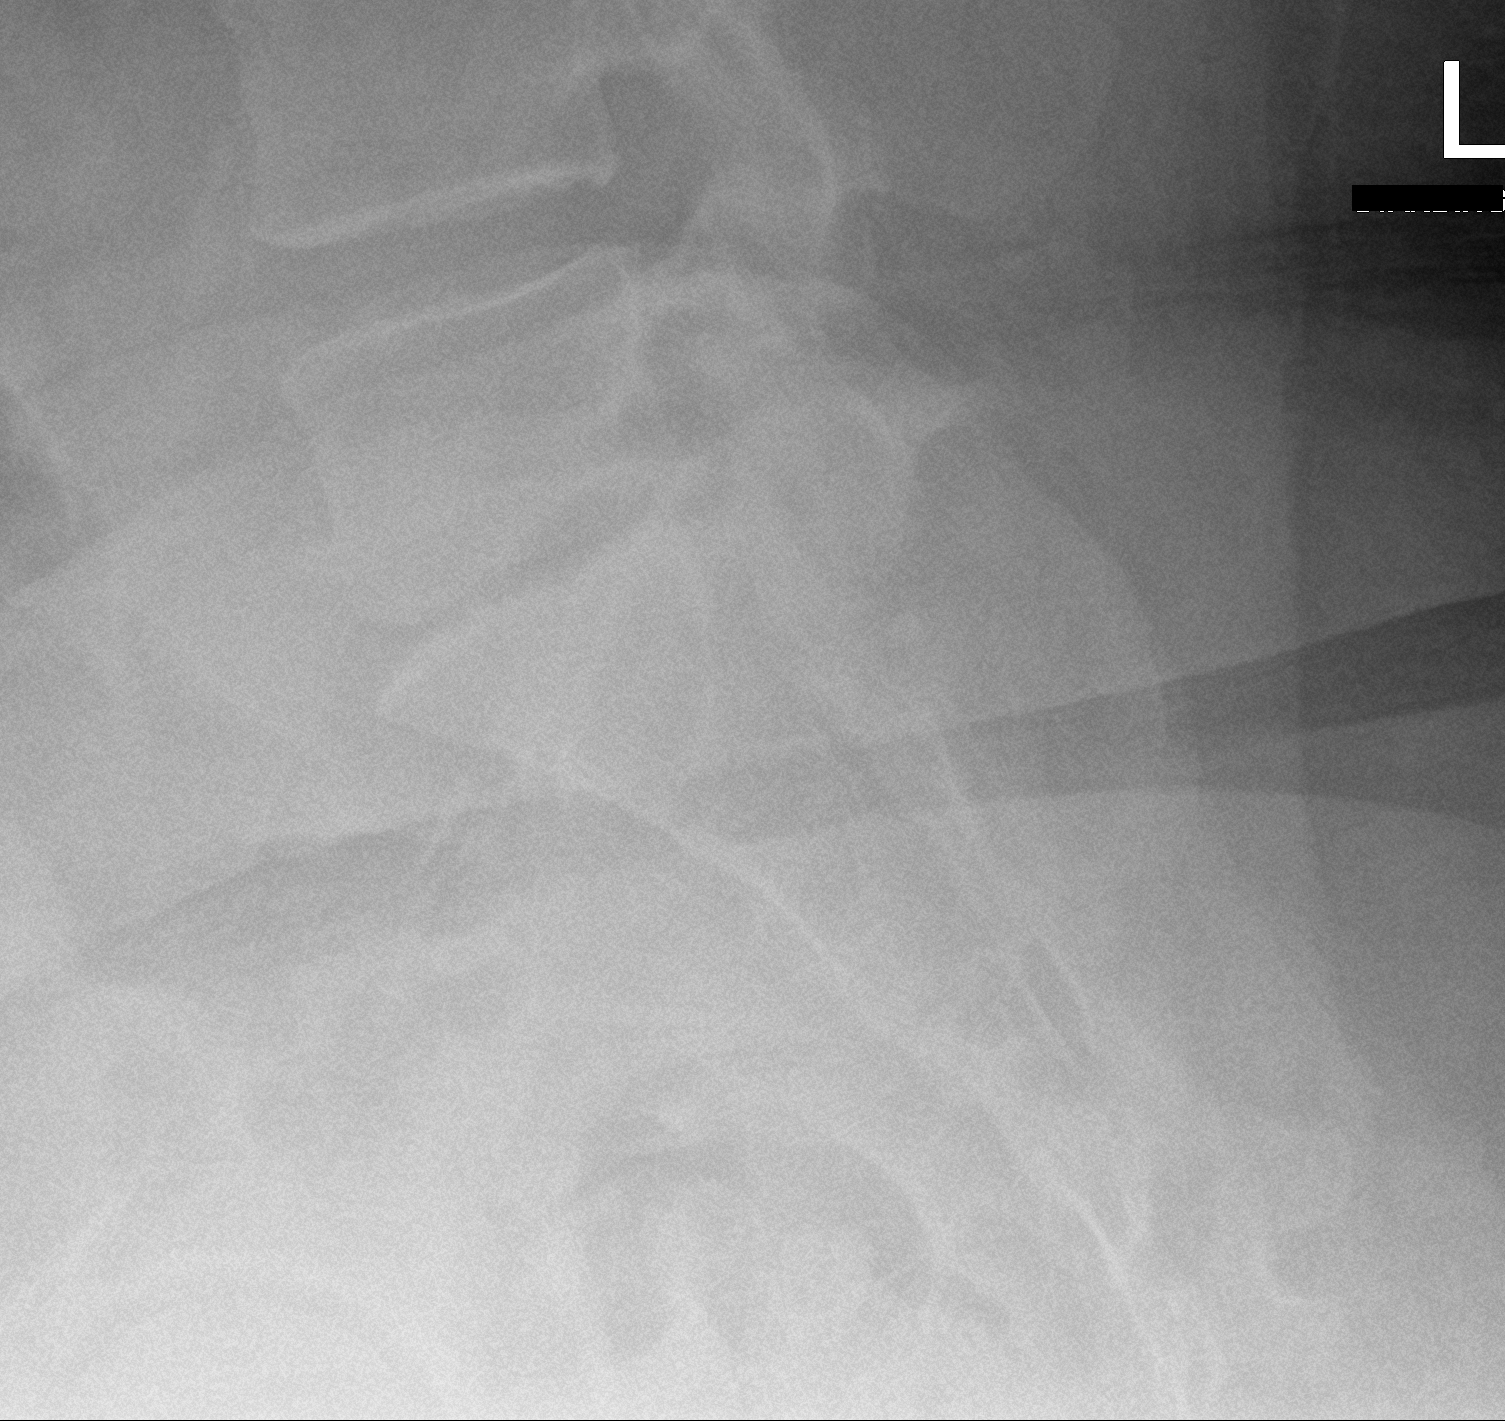

[3 of 3 positions shown; findings below may reference images not displayed]

FINDINGS: Standing frontal, standing lateral, and standing spot lumbosacral
lateral images were obtained. There are 5 non-rib-bearing lumbar
type vertebral bodies. There is no fracture or spondylolisthesis.
There is moderate disc space narrowing at L5-S1. Other disc spaces
appear unremarkable. There is facet osteoarthritic change at L5-S1
bilaterally.
IMPRESSION: Osteoarthritic change at L5-S1. Other disc spaces appear
unremarkable. No fracture or spondylolisthesis.

## 2021-11-28 ENCOUNTER — Encounter (HOSPITAL_BASED_OUTPATIENT_CLINIC_OR_DEPARTMENT_OTHER): Payer: Self-pay

## 2021-11-28 DIAGNOSIS — G4730 Sleep apnea, unspecified: Secondary | ICD-10-CM

## 2021-12-25 ENCOUNTER — Ambulatory Visit (HOSPITAL_BASED_OUTPATIENT_CLINIC_OR_DEPARTMENT_OTHER): Payer: 59 | Attending: Family Medicine | Admitting: Internal Medicine

## 2021-12-25 VITALS — Ht 67.0 in | Wt 380.0 lb

## 2021-12-25 DIAGNOSIS — G4733 Obstructive sleep apnea (adult) (pediatric): Secondary | ICD-10-CM | POA: Diagnosis not present

## 2021-12-25 DIAGNOSIS — G4730 Sleep apnea, unspecified: Secondary | ICD-10-CM | POA: Diagnosis present

## 2022-01-08 DIAGNOSIS — G4730 Sleep apnea, unspecified: Secondary | ICD-10-CM | POA: Diagnosis not present

## 2022-01-08 NOTE — Procedures (Signed)
    Patient Name: Patricia Brock, [place] Date: 12/25/2021 Gender: Female D.O.B: 10-Feb-1986 Age (years): 35 Referring Provider: Lorenda Cahill FNP Height (inches): 67 Interpreting Physician: Jetty Duhamel MD, ABSM Weight (lbs): 380 RPSGT: [place] BMI: 60 MRN: [id number] Neck Size: 17.00  CLINICAL INFORMATION Sleep Study Type: HST Indication for sleep study: OSA Epworth Sleepiness Score: 6  SLEEP STUDY TECHNIQUE A multi-channel overnight portable sleep study was performed. The channels recorded were: nasal airflow, thoracic respiratory movement, and oxygen saturation with a pulse oximetry. Snoring was also monitored.  MEDICATIONS Patient self administered medications include: none reported.  SLEEP ARCHITECTURE Patient was studied for 365 minutes. The sleep efficiency was 100.0 % and the patient was supine for 0%. The arousal index was 0.0 per hour.  RESPIRATORY PARAMETERS The overall AHI was 23.8 per hour, with a central apnea index of 0 per hour. The oxygen nadir was 88% during sleep.  CARDIAC DATA Mean heart rate during sleep was 78.0 bpm.  IMPRESSIONS - Moderate obstructive sleep apnea occurred during this study (AHI = 23.8/h). - Mild oxygen desaturation was noted during this study (Min O2 = 88%). Mean 97% - Patient snored.  DIAGNOSIS - Obstructive Sleep Apnea (G47.33)  RECOMMENDATIONS - Suggest CPAP titration sleeep study or autopap. Other options would be based on clinical judgment. - Be careful with alcohol, sedatives and other CNS depressants that may worsen sleep apnea and disrupt normal sleep architecture. - Sleep hygiene should be reviewed to assess factors that may improve sleep quality. - Weight management and regular exercise should be initiated or continued.  [Electronically signed] 01/08/2022 12:12 PM  Jetty Duhamel MD, ABSM Diplomate, American Board of Sleep Medicine NPI: [id number]                         Jetty Duhamel Diplomate, American Board of Sleep Medicine  ELECTRONICALLY SIGNED ON:  01/08/2022, 12:09 PM [facility] PH: [phone]   FX: [phone] ACCREDITED BY THE AMERICAN ACADEMY OF SLEEP MEDICINE

## 2022-02-15 ENCOUNTER — Ambulatory Visit: Payer: Medicaid Other | Admitting: Nurse Practitioner

## 2022-02-15 ENCOUNTER — Encounter: Payer: Self-pay | Admitting: Nurse Practitioner

## 2022-02-15 ENCOUNTER — Ambulatory Visit (LOCAL_COMMUNITY_HEALTH_CENTER): Payer: Medicaid Other | Admitting: Nurse Practitioner

## 2022-02-15 VITALS — BP 162/100 | Ht 67.0 in | Wt 395.2 lb

## 2022-02-15 DIAGNOSIS — A599 Trichomoniasis, unspecified: Secondary | ICD-10-CM

## 2022-02-15 DIAGNOSIS — Z309 Encounter for contraceptive management, unspecified: Secondary | ICD-10-CM

## 2022-02-15 DIAGNOSIS — Z3009 Encounter for other general counseling and advice on contraception: Secondary | ICD-10-CM

## 2022-02-15 DIAGNOSIS — Z01419 Encounter for gynecological examination (general) (routine) without abnormal findings: Secondary | ICD-10-CM

## 2022-02-15 DIAGNOSIS — Z113 Encounter for screening for infections with a predominantly sexual mode of transmission: Secondary | ICD-10-CM

## 2022-02-15 LAB — WET PREP FOR TRICH, YEAST, CLUE
Trichomonas Exam: POSITIVE — AB
Yeast Exam: NEGATIVE

## 2022-02-15 LAB — HM HIV SCREENING LAB: HM HIV Screening: NEGATIVE

## 2022-02-15 MED ORDER — METRONIDAZOLE 500 MG PO TABS: 500.0000 mg | ORAL_TABLET | Freq: Two times a day (BID) | ORAL | 0 refills | Status: AC

## 2022-02-15 NOTE — Progress Notes (Signed)
[facility] DEPARTMENT [facility] [address] Main Number: [phone]    Family Planning Visit- Initial Visit  Subjective:  Patricia Brock is a 36 y.o.  [phone]   being seen today for an initial annual visit and to discuss reproductive life planning.  The patient is currently using Hormonal Implant for pregnancy prevention. Patient reports   does not want a pregnancy in the next year.     report they are looking for a method that provides High efficacy at preventing pregnancy  Patient has the following medical conditions has Morbid obesity (HCC) BMI=56.4 (360 lbs); Elevated BP without diagnosis of hypertension 161/97, 162/101; Asthma; and Abnormal Pap smear of cervix on their problem list.  Chief Complaint  Patient presents with   Contraception    Patient reports to clinic today for a physical, STD screening, and to have Nexplanon removed and reinserted.     There is no height or weight on file to calculate BMI. - Patient is eligible for diabetes screening based on BMI and age >42?  not applicable HA1C ordered? not applicable  Patient reports 2  partner/s in last year. Desires STI screening?  Yes  Has patient been screened once for HCV in the past?  No   Lab Results  Component Value Date   HCVAB <0.1 05/20/2018    Does the patient have current drug use (including MJ), have a partner with drug use, and/or has been incarcerated since last result? No  If yes-- Screen for HCV through [facility]   Does the patient meet criteria for HBV testing? No  Criteria:  -Household, sexual or needle sharing contact with HBV -History of drug use -HIV positive -Those with known Hep C   Health Maintenance Due  Topic Date Due   COVID-19 Vaccine (1) Never done   HIV Screening  Never done   TETANUS/TDAP  07/23/2020   INFLUENZA VACCINE  Never done    Review of Systems  Constitutional:  Negative for chills, fever, malaise/fatigue and weight  loss.  HENT:  Negative for congestion, hearing loss and sore throat.   Eyes:  Negative for blurred vision, double vision and photophobia.  Respiratory:  Negative for shortness of breath.   Cardiovascular:  Negative for chest pain.  Gastrointestinal:  Negative for abdominal pain, blood in stool, constipation, diarrhea, heartburn, nausea and vomiting.  Genitourinary:  Negative for dysuria and frequency.       Weight gain   Musculoskeletal:  Negative for back pain, joint pain and neck pain.       Calf pain while walking   Skin:  Negative for itching and rash.  Neurological:  Negative for dizziness, weakness and headaches.  Endo/Heme/Allergies:  Does not bruise/bleed easily.  Psychiatric/Behavioral:  Negative for depression, substance abuse and suicidal ideas.     The following portions of the patient's history were reviewed and updated as appropriate: allergies, current medications, past family history, past medical history, past social history, past surgical history and problem list. Problem list updated.   See flowsheet for other program required questions.  Objective:  There were no vitals filed for this visit.  Physical Exam Constitutional:      Appearance: Normal appearance.  HENT:     Head: Normocephalic. No abrasion, masses or laceration. Hair is normal.     Jaw: No tenderness or swelling.     Right Ear: External ear normal.     Left Ear: External ear normal.     Nose:  Nose normal.     Mouth/Throat:     Lips: Pink. No lesions.     Mouth: Mucous membranes are moist. No lacerations or oral lesions.     Dentition: No dental caries.     Tongue: No lesions.     Palate: No mass and lesions.     Pharynx: No pharyngeal swelling, oropharyngeal exudate, posterior oropharyngeal erythema or uvula swelling.     Tonsils: No tonsillar exudate or tonsillar abscesses.     Comments: No visible signs of dental caries  Eyes:     Pupils: Pupils are equal, round, and reactive to light.   Neck:     Thyroid: No thyroid mass, thyromegaly or thyroid tenderness.  Cardiovascular:     Rate and Rhythm: Normal rate and regular rhythm.  Pulmonary:     Effort: Pulmonary effort is normal.     Breath sounds: Normal breath sounds.  Chest:  Breasts:    Right: Normal. No swelling, mass, nipple discharge, skin change or tenderness.     Left: Normal. No swelling, mass, nipple discharge, skin change or tenderness.  Abdominal:     General: Abdomen is flat. Bowel sounds are normal.     Palpations: Abdomen is soft.     Tenderness: There is no abdominal tenderness. There is no rebound.  Genitourinary:    Pubic Area: No rash or pubic lice.      Labia:        Right: No rash, tenderness or lesion.        Left: No rash, tenderness or lesion.      Vagina: Normal. No vaginal discharge, erythema, tenderness or lesions.     Cervix: No cervical motion tenderness, discharge, lesion or erythema.     Uterus: Normal.      Adnexa:        Right: No tenderness.         Left: No tenderness.       Rectum: Normal.     Comments: Amount Discharge: small  Odor: No pH: less than 4.5 Adheres to vaginal wall: No Color: color of discharge matches the Mubarak Bevens swab  Musculoskeletal:     Cervical back: Full passive range of motion without pain and normal range of motion.  Lymphadenopathy:     Cervical: No cervical adenopathy.     Right cervical: No superficial, deep or posterior cervical adenopathy.    Left cervical: No superficial, deep or posterior cervical adenopathy.     Upper Body:     Right upper body: No supraclavicular, axillary or epitrochlear adenopathy.     Left upper body: No supraclavicular, axillary or epitrochlear adenopathy.     Lower Body: No right inguinal adenopathy. No left inguinal adenopathy.  Skin:    General: Skin is warm and dry.     Findings: No erythema, laceration, lesion or rash.  Neurological:     Mental Status: She is alert and oriented to person, place, and time.   Psychiatric:        Attention and Perception: Attention normal.        Mood and Affect: Mood normal.        Speech: Speech normal.        Behavior: Behavior normal. Behavior is cooperative.       Assessment and Plan:  Patricia Brock is a 36 y.o. female presenting to the [facility] Department for an initial annual wellness/contraceptive visit  Contraception counseling: Reviewed options based on patient desire and reproductive life plan. Patient is  interested in Hormonal Implant.   Risks, benefits, and typical effectiveness rates were reviewed.  Questions were answered.  Written information was also given to the patient to review.    The patient will follow up in  1 years for surveillance.  The patient was told to call with any further questions, or with any concerns about this method of contraception.  Emphasized use of condoms 100% of the time for STI prevention.  Need for ECP was assessed. ECP not offered today due to continued use of birth control.   1. Family planning counseling -36 year old female in clinic today for a physical, STD screening, and Nexplanon removal and reinsertion.  Patient had Nexplanon inserted 02/2019.  Informed patient that her Nexplanon was good for an additional year.   Patient desired for device to be removed and reinserted today.  Upon removal unable to palpate device.  Will refer to [facility] for U/S and removal.   -ROS reviewed, patient with complaints of weight gain and calf pain with walking.  Advised patient increase water intake, exercise, and limit carbs, sugars, and fats. Also recommended support stockings while walking and to assess for swelling and redness.  -BP elevated during visit today advised to follow up with PCP regarding BP.  Patient with a history of hypertension.    2. Well woman exam with routine gynecological exam -Normal well woman exam. -PAP due 01/2023.  Last PAP and follow-up through [facility], advised to  follow up with [facility]. -CBE today, next due 02/2025  3. Screening examination for venereal disease -STD screening. -Patient accepted all screenings including oral, vaginal CT/GC, wet prep and bloodwork for HIV/RPR.  Patient meets criteria for HepB screening? No. Ordered? No - low risk  Patient meets criteria for HepC screening? No. Ordered? No - low risk   Treat wet prep per standing order Discussed time line for [facility] results and that patient will be called with positive results and encouraged patient to call if she had not heard in 2 weeks.  Counseled to return or seek care for continued or worsening symptoms Recommended condom use with all sex  Patient is currently using *Nexplanon to prevent pregnancy.    - HIV [place] [facility] - Syphilis Serology, [place] [facility] - Chlamydia/Gonorrhea [place] [facility] - Chlamydia/Gonorrhea [place] [facility] - WET PREP FOR TRICH, YEAST, CLUE   4. Trichomoniasis -Wet mount reviewed, patient treated for Trich.  Advised no sex for 7 days and 7 days after partner is treated.      Total time spent: 40 minutes   Return in about 1 year (around 02/16/2023) for Annual well-woman exam.    Glenna Fellows, FNP

## 2022-02-15 NOTE — Progress Notes (Signed)
Patient here for PE and Nexplanon removal and reinsertion. Last PE was 2017, pap due 2024. Patient states she had this nexplanon placed in 2020, date was 02/13/2019. Will follow-up with [facility] for next Pap.Jenetta Downer, RN

## 2022-05-22 ENCOUNTER — Telehealth: Payer: Self-pay | Admitting: Family Medicine

## 2022-05-22 NOTE — Telephone Encounter (Signed)
Patient would like to have a Nurse call back. She said something about her Nex not being found and would like to talk to someone about having it removed.

## 2022-05-23 NOTE — Telephone Encounter (Signed)
I spoke with the patient and informed her that a new referral was sent to [facility].  Pt is requesting to be sent to [facility].  I spoke with Gregary Cromer FNP and she recommended that the patient make an appointment with [facility] [place].  Pt notified and phone number for the facility given to pt.

## 2022-05-23 NOTE — Telephone Encounter (Signed)
Referral resubmitted in regards to Nexplanon removal through [facility] [place]. Gregary Cromer, FNP

## 2022-05-29 ENCOUNTER — Other Ambulatory Visit (HOSPITAL_BASED_OUTPATIENT_CLINIC_OR_DEPARTMENT_OTHER): Payer: Self-pay

## 2022-05-29 DIAGNOSIS — G4733 Obstructive sleep apnea (adult) (pediatric): Secondary | ICD-10-CM

## 2022-06-11 ENCOUNTER — Telehealth: Payer: Self-pay

## 2022-06-11 NOTE — Telephone Encounter (Signed)
Called pt to confirm referral appointment, left a message asking for her to call us back.

## 2022-06-21 ENCOUNTER — Ambulatory Visit (HOSPITAL_BASED_OUTPATIENT_CLINIC_OR_DEPARTMENT_OTHER): Payer: Medicaid Other | Attending: Family Medicine | Admitting: Internal Medicine

## 2022-06-21 VITALS — Ht 67.0 in | Wt 380.0 lb

## 2022-06-21 DIAGNOSIS — G4733 Obstructive sleep apnea (adult) (pediatric): Secondary | ICD-10-CM | POA: Insufficient documentation

## 2022-07-01 DIAGNOSIS — G4733 Obstructive sleep apnea (adult) (pediatric): Secondary | ICD-10-CM | POA: Diagnosis not present

## 2022-07-01 NOTE — Procedures (Signed)
Patient Name: Patricia Brock, Patricia Brock Date: 06/21/2022 Gender: Female D.O.B: 1985/08/20 Age (years): 36 Referring Provider: Hendricks Milo FNP Height (inches): 27 Interpreting Physician: Baird Lyons MD, ABSM Weight (lbs): 380 RPSGT: Baxter Flattery BMI: 60 MRN: [id number] Neck Size: 17.00  CLINICAL INFORMATION The patient is referred for a CPAP titration to treat sleep apnea.  Date of NPSG, Split Night or HST:  HST 12/25/21  AHI 23.8/ hr, desaturation to 88%, body weight 380 lbs  SLEEP STUDY TECHNIQUE As per the AASM Manual for the Scoring of Sleep and Associated Events v2.3 (April 2016) with a hypopnea requiring 4% desaturations.  The channels recorded and monitored were frontal, central and occipital EEG, electrooculogram (EOG), submentalis EMG (chin), nasal and oral airflow, thoracic and abdominal wall motion, anterior tibialis EMG, snore microphone, electrocardiogram, and pulse oximetry. Continuous positive airway pressure (CPAP) was initiated at the beginning of the study and titrated to treat sleep-disordered breathing.  MEDICATIONS Medications self-administered by patient taken the night of the study : none reported  TECHNICIAN COMMENTS Comments added by technician: Patient had difficulty initiating sleep. Comments added by scorer: N/A  RESPIRATORY PARAMETERS Optimal PAP Pressure (cm): 10 AHI at Optimal Pressure (/hr): 1.6 Overall Minimal O2 (%): 89.0 Supine % at Optimal Pressure (%): 100 Minimal O2 at Optimal Pressure (%): 90.0   SLEEP ARCHITECTURE The study was initiated at 11:03:44 PM and ended at 5:01:47 AM.  Sleep onset time was 36.0 minutes and the sleep efficiency was 58.5%. The total sleep time was 209.5 minutes.  The patient spent 1.2% of the night in stage N1 sleep, 70.2% in stage N2 sleep, 0.0% in stage N3 and 28.6% in REM. Stage REM latency was 192.0 minutes  Wake after sleep onset was 112.5. Alpha intrusion was absent. Supine sleep was 69.91%.  CARDIAC  DATA The 2 lead EKG demonstrated sinus rhythm. The mean heart rate was 77.4 beats per minute. Other EKG findings include: None.  LEG MOVEMENT DATA The total Periodic Limb Movements of Sleep (PLMS) were 0. The PLMS index was 0.0. A PLMS index of <15 is considered normal in adults.  IMPRESSIONS - The optimal PAP pressure was 10 cm of water. - Mild oxygen desaturations were observed during this titration (min O2 = 89.0%).  Minimum O2 saturation on CPAP 10 was 90%. - No snoring was audible during this study. - No cardiac abnormalities were observed during this study. - Clinically significant periodic limb movements were not noted during this study. Arousals associated with PLMs were rare.  DIAGNOSIS - Obstructive Sleep Apnea (G47.33)  RECOMMENDATIONS - Trial of CPAP therapy on 10 cm H2O or autopap 5-15. - Patient used a Standard size Resmed Nasal Mirage FX mask and heated humidification. - Be careful with alcohol, sedatives and other CNS depressants that may worsen sleep apnea and disrupt normal sleep architecture. - Sleep hygiene should be reviewed to assess factors that may improve sleep quality. - Weight management and regular exercise should be initiated or continued. - Return to Sleep Center for re-evaluation after 4 weeks of therapy - airway pressurization therapy at centimeters of water pressure with a mask and heated humidification  [Electronically signed] 07/01/2022 01:11 PM  Baird Lyons MD, [employer], American Board of Sleep Medicine NPI: [id number]                         [employer], [place] [facility]  ELECTRONICALLY SIGNED ON:  07/01/2022, 1:08 PM [facility] PH: [phone]   FX: [phone] [place]

## 2022-12-01 ENCOUNTER — Emergency Department (HOSPITAL_COMMUNITY): Payer: Medicaid Other

## 2022-12-01 ENCOUNTER — Other Ambulatory Visit: Payer: Self-pay

## 2022-12-01 ENCOUNTER — Encounter (HOSPITAL_COMMUNITY): Payer: Self-pay | Admitting: Emergency Medicine

## 2022-12-01 ENCOUNTER — Emergency Department (HOSPITAL_COMMUNITY)
Admission: EM | Admit: 2022-12-01 | Discharge: 2022-12-01 | Disposition: A | Discharge status: Home / Self Care | Payer: Medicaid Other | Attending: Emergency Medicine | Admitting: Emergency Medicine

## 2022-12-01 DIAGNOSIS — M25561 Pain in right knee: Secondary | ICD-10-CM | POA: Diagnosis not present

## 2022-12-01 DIAGNOSIS — I10 Essential (primary) hypertension: Secondary | ICD-10-CM | POA: Diagnosis not present

## 2022-12-01 NOTE — Discharge Instructions (Addendum)
Please follow-up with your primary care doctor, return to the ER if you have any worsening knee pain, inability to walk, or coolness of your extremities.  Use the knee brace as provided, and use Tylenol and ibuprofen for pain control.

## 2022-12-01 NOTE — ED Notes (Signed)
Discharge instructions reviewed and referrals discussed.   Ace wrap to right knee as brace not compatible. Nonpharmacologic interventions encouraged along with tylenol and ibuprofen.   Ambulatory on departure.

## 2022-12-01 NOTE — ED Triage Notes (Signed)
Pt with right knee pain for 5 days.  Progressively worsening. No injury.

## 2022-12-01 NOTE — ED Provider Notes (Signed)
[place] EMERGENCY DEPARTMENT AT [facility] Provider Note   CSN: [id number] Arrival date & time: 12/01/22  1949     History  Chief Complaint  Patient presents with   Knee Pain    Patricia Brock is a 37 y.o. female, hx of HTN, who presents to the ED 2/2 to R knee pain radiating to hip and foot intermittently for the last 5 days. Reports R knee pain that is achy and worse with flexion for the last 5 days. Initially radiated to foot then resolved. Next day radiated to hip. Denies any trauma. Notes that her R knee is the only thing that hurts.     Home Medications Prior to Admission medications   Medication Sig Start Date End Date Taking? Authorizing Provider  albuterol (PROVENTIL) (2.5 MG/3ML) 0.083% nebulizer solution Take 2.5 mg by nebulization every 6 (six) hours as needed for wheezing or shortness of breath.    [provider]  Baclofen 5 MG TABS Take 5 mg by mouth 3 (three) times daily as needed. 12/28/19   Enid Derry, PA-C  cyclobenzaprine (FLEXERIL) 5 MG tablet Take 1-2 tablets 3 times daily as needed Patient not taking: Reported on 02/13/2019 05/20/18   Enid Derry, PA-C  etodolac (LODINE) 200 MG capsule Take 1 capsule (200 mg total) by mouth every 8 (eight) hours. Patient not taking: Reported on 03/31/2018 11/12/16   Rebecka Apley, MD  ibuprofen (ADVIL) 600 MG tablet Take 1 tablet (600 mg total) by mouth every 6 (six) hours as needed. 12/28/19   Enid Derry, PA-C  ketorolac (TORADOL) 10 MG tablet Take 1 tablet (10 mg total) by mouth every 6 (six) hours as needed. Patient not taking: Reported on 02/13/2019 05/20/18   Enid Derry, PA-C      Allergies    Patient has no known allergies.    Review of Systems   Review of Systems  Musculoskeletal:  Negative for joint swelling.    Physical Exam Updated Vital Signs BP (!) 147/102 (BP Location: Right Arm)   Pulse (!) 104   Resp 19   SpO2 93%  Physical Exam Vitals and nursing note reviewed.   Constitutional:      General: She is not in acute distress.    Appearance: She is well-developed.  HENT:     Head: Normocephalic and atraumatic.  Eyes:     General:        Right eye: No discharge.        Left eye: No discharge.     Conjunctiva/sclera: Conjunctivae normal.  Pulmonary:     Effort: No respiratory distress.  Musculoskeletal:     Comments: Right Knee: Tenderness to palpation of lateral joint line. An effusion is not present.  Negative anterior and posterior drawer. Negative Mcmurray's. +Patellar stability. Negative valgus and varus stress test.. Extension and flexion intact. No sensory deficits.    Neurological:     Mental Status: She is alert.     Comments: Clear speech.   Psychiatric:        Behavior: Behavior normal.        Thought Content: Thought content normal.     ED Results / Procedures / Treatments   Labs (all labs ordered are listed, but only abnormal results are displayed) Labs Reviewed - No data to display  EKG None  Radiology DG Knee Complete 4 Views Right  Result Date: 12/01/2022 CLINICAL DATA:  Pain for 5 days.  No history of injury. EXAM: RIGHT KNEE - COMPLETE 4  VIEW COMPARISON:  None Available. FINDINGS: No evidence of fracture, dislocation, or joint effusion. No evidence of arthropathy or other focal bone abnormality. Soft tissues are unremarkable. IMPRESSION: No acute osseous abnormality. Electronically Signed   By: Karen Kays M.D.   On: 12/01/2022 20:14    Procedures Procedures    Medications Ordered in ED Medications - No data to display  ED Course/ Medical Decision Making/ A&P                             Medical Decision Making Patient is a 37 year old female, here for right knee pain, that is a few, going on for the last 5 days.  She has tried Tylenol, ibuprofen, still has having problems.  States it feels achy, and radiates to her thigh and occasionally down her leg occasionally.  She has negative straight leg raise, she has  tenderness to palpation in the lateral joint line, no evidence of erythema, edema.  This may be a knee strain.  I will place her in a knee brace, and have her follow-up with orthopedics as needed.  She has no swelling of her extremity, no tenderness to palpation posterior knee, just on the lateral joint line.  This may be stress induced, secondary to obesity.  Discussed conservative care patient in agreement with plan  Amount and/or Complexity of Data Reviewed Radiology: ordered.    Details: X-ray unremarkable    Final Clinical Impression(s) / ED Diagnoses Final diagnoses:  Acute pain of right knee    Rx / DC Orders ED Discharge Orders     None         Andrue Dini, [place], PA 12/01/22 2119    Tanda Rockers A, DO 12/02/22 [phone]

## 2022-12-14 ENCOUNTER — Ambulatory Visit (INDEPENDENT_AMBULATORY_CARE_PROVIDER_SITE_OTHER): Payer: Medicaid Other | Admitting: Orthopaedic Surgery

## 2022-12-14 ENCOUNTER — Ambulatory Visit (HOSPITAL_BASED_OUTPATIENT_CLINIC_OR_DEPARTMENT_OTHER): Payer: Medicaid Other

## 2022-12-14 ENCOUNTER — Other Ambulatory Visit (HOSPITAL_BASED_OUTPATIENT_CLINIC_OR_DEPARTMENT_OTHER): Payer: Self-pay

## 2022-12-14 ENCOUNTER — Encounter (HOSPITAL_BASED_OUTPATIENT_CLINIC_OR_DEPARTMENT_OTHER): Payer: Self-pay | Admitting: Orthopaedic Surgery

## 2022-12-14 DIAGNOSIS — M25551 Pain in right hip: Secondary | ICD-10-CM | POA: Diagnosis not present

## 2022-12-14 MED ORDER — METHYLPREDNISOLONE 4 MG PO TBPK
ORAL_TABLET | ORAL | 0 refills | Status: AC
  Filled 2022-12-14: qty 21, 6d supply, fill #0

## 2022-12-14 NOTE — Progress Notes (Signed)
Chief Complaint: Right leg pain     History of Present Illness:    Patricia Brock is a 37 y.o. female presents today with radiating right leg pain that has been worse for the last 2 weeks.  She did not initially go to the emergency room where knee x-rays were performed and found to be normal.  She states that she has had similar pain to this in the past although this is now been staying around the neck ongoing for the last 2 months.  She is experiencing weakness and giving out.  She is experiencing numbness in the right foot.    Surgical History:   None  PMH/PSH/Family History/Social History/Meds/Allergies:    Past Medical History:  Diagnosis Date   Asthma    Herpes simplex virus (HSV) infection    Hypertension    Vaginal Pap smear, abnormal    Past Surgical History:  Procedure Laterality Date   LEEP     Social History   Socioeconomic History   Marital status: Single    Spouse name: Not on file   Number of children: 3   Years of education: 12+   Highest education level: Associate degree: academic program  Occupational History   Not on file  Tobacco Use   Smoking status: Never    Passive exposure: Never   Smokeless tobacco: Never  Vaping Use   Vaping status: Never Used  Substance and Sexual Activity   Alcohol use: Yes    Comment: occasional   Drug use: No   Sexual activity: Yes    Partners: Male    Birth control/protection: Implant, Pill, Injection  Other Topics Concern   Not on file  Social History Narrative   Not on file   Social Determinants of Health   Financial Resource Strain: Not on file  Food Insecurity: Not on file  Transportation Needs: Not on file  Physical Activity: Not on file  Stress: Not on file  Social Connections: Not on file   Family History  Problem Relation Age of Onset   Cancer Maternal Grandmother    Hypertension Mother    No Known Allergies Current Outpatient Medications  Medication Sig  Dispense Refill   methylPREDNISolone (MEDROL DOSEPAK) 4 MG TBPK tablet Take per packet instructions 1 each 0   albuterol (PROVENTIL) (2.5 MG/3ML) 0.083% nebulizer solution Take 2.5 mg by nebulization every 6 (six) hours as needed for wheezing or shortness of breath.     Baclofen 5 MG TABS Take 5 mg by mouth 3 (three) times daily as needed. 15 tablet 0   cyclobenzaprine (FLEXERIL) 5 MG tablet Take 1-2 tablets 3 times daily as needed (Patient not taking: Reported on 02/13/2019) 20 tablet 0   etodolac (LODINE) 200 MG capsule Take 1 capsule (200 mg total) by mouth every 8 (eight) hours. (Patient not taking: Reported on 03/31/2018) 12 capsule 0   ibuprofen (ADVIL) 600 MG tablet Take 1 tablet (600 mg total) by mouth every 6 (six) hours as needed. 30 tablet 0   ketorolac (TORADOL) 10 MG tablet Take 1 tablet (10 mg total) by mouth every 6 (six) hours as needed. (Patient not taking: Reported on 02/13/2019) 20 tablet 0   No current facility-administered medications for this visit.   No results found.  Review of Systems:   A ROS was performed  including pertinent positives and negatives as documented in the HPI.  Physical Exam :   Constitutional: NAD and appears stated age Neurological: Alert and oriented Psych: Appropriate affect and cooperative There were no vitals taken for this visit.   Comprehensive Musculoskeletal Exam:    Tenderness palpation about the right lower back with positive straight leg raise.  Otherwise distal neurosensory exam is intact  Imaging:   Xray (right hip 4 views, right knee 4 views): Normal    I personally reviewed and interpreted the radiographs.   Assessment:   37 y.o. female with right lower leg pain consistent with lumbar radiculopathy.  At today's visit I did advise given her young age and the fact that this has resolved in the past she is symptomatic treatment that we would begin with a Medrol Dosepak.  Will plan to proceed with this.  I will see her back as  needed  Plan :    -Return to clinic as needed     I personally saw and evaluated the patient, and participated in the management and treatment plan.  Huel Cote, MD Attending Physician, Orthopedic Surgery  This document was dictated using Dragon voice recognition software. A reasonable attempt at proof reading has been made to minimize errors.

## 2023-01-07 ENCOUNTER — Encounter (HOSPITAL_BASED_OUTPATIENT_CLINIC_OR_DEPARTMENT_OTHER): Payer: Self-pay | Admitting: Orthopaedic Surgery

## 2023-01-08 ENCOUNTER — Other Ambulatory Visit (HOSPITAL_BASED_OUTPATIENT_CLINIC_OR_DEPARTMENT_OTHER): Payer: Self-pay | Admitting: Orthopaedic Surgery

## 2023-01-08 DIAGNOSIS — M5416 Radiculopathy, lumbar region: Secondary | ICD-10-CM

## 2023-01-08 DIAGNOSIS — M25551 Pain in right hip: Secondary | ICD-10-CM

## 2023-02-01 ENCOUNTER — Ambulatory Visit
Admission: RE | Admit: 2023-02-01 | Discharge: 2023-02-01 | Disposition: A | Discharge status: Home / Self Care | Payer: Medicaid Other | Source: Ambulatory Visit | Attending: Orthopaedic Surgery

## 2023-02-01 DIAGNOSIS — M5416 Radiculopathy, lumbar region: Secondary | ICD-10-CM

## 2023-02-20 ENCOUNTER — Ambulatory Visit (HOSPITAL_BASED_OUTPATIENT_CLINIC_OR_DEPARTMENT_OTHER): Payer: Medicaid Other | Admitting: Orthopaedic Surgery

## 2023-02-20 DIAGNOSIS — M5416 Radiculopathy, lumbar region: Secondary | ICD-10-CM | POA: Diagnosis not present

## 2023-02-20 NOTE — Progress Notes (Signed)
Chief Complaint: Right leg pain     History of Present Illness:   02/20/2023: Presents today for follow-up and MRI discussion.  Olamae Brotzman is a 37 y.o. female presents today with radiating right leg pain that has been worse for the last 2 weeks.  She did not initially go to the emergency room where knee x-rays were performed and found to be normal.  She states that she has had similar pain to this in the past although this is now been staying around the neck ongoing for the last 2 months.  She is experiencing weakness and giving out.  She is experiencing numbness in the right foot.    Surgical History:   None  PMH/PSH/Family History/Social History/Meds/Allergies:    Past Medical History:  Diagnosis Date   Asthma    Herpes simplex virus (HSV) infection    Hypertension    Vaginal Pap smear, abnormal    Past Surgical History:  Procedure Laterality Date   LEEP     Social History   Socioeconomic History   Marital status: Single    Spouse name: Not on file   Number of children: 3   Years of education: 12+   Highest education level: Associate degree: academic program  Occupational History   Not on file  Tobacco Use   Smoking status: Never    Passive exposure: Never   Smokeless tobacco: Never  Vaping Use   Vaping status: Never Used  Substance and Sexual Activity   Alcohol use: Yes    Comment: occasional   Drug use: No   Sexual activity: Yes    Partners: Male    Birth control/protection: Implant, Pill, Injection  Other Topics Concern   Not on file  Social History Narrative   Not on file   Social Determinants of Health   Financial Resource Strain: Not on file  Food Insecurity: Not on file  Transportation Needs: Not on file  Physical Activity: Not on file  Stress: Not on file  Social Connections: Not on file   Family History  Problem Relation Age of Onset   Cancer Maternal Grandmother    Hypertension Mother    No Known  Allergies Current Outpatient Medications  Medication Sig Dispense Refill   albuterol (PROVENTIL) (2.5 MG/3ML) 0.083% nebulizer solution Take 2.5 mg by nebulization every 6 (six) hours as needed for wheezing or shortness of breath.     Baclofen 5 MG TABS Take 5 mg by mouth 3 (three) times daily as needed. 15 tablet 0   cyclobenzaprine (FLEXERIL) 5 MG tablet Take 1-2 tablets 3 times daily as needed (Patient not taking: Reported on 02/13/2019) 20 tablet 0   etodolac (LODINE) 200 MG capsule Take 1 capsule (200 mg total) by mouth every 8 (eight) hours. (Patient not taking: Reported on 03/31/2018) 12 capsule 0   ibuprofen (ADVIL) 600 MG tablet Take 1 tablet (600 mg total) by mouth every 6 (six) hours as needed. 30 tablet 0   ketorolac (TORADOL) 10 MG tablet Take 1 tablet (10 mg total) by mouth every 6 (six) hours as needed. (Patient not taking: Reported on 02/13/2019) 20 tablet 0   methylPREDNISolone (MEDROL DOSEPAK) 4 MG TBPK tablet Take per packet instructions 21 each 0   No current facility-administered medications for this visit.   No results found.  Review  of Systems:   A ROS was performed including pertinent positives and negatives as documented in the HPI.  Physical Exam :   Constitutional: NAD and appears stated age Neurological: Alert and oriented Psych: Appropriate affect and cooperative There were no vitals taken for this visit.   Comprehensive Musculoskeletal Exam:    Tenderness palpation about the right lower back with positive straight leg raise.  Otherwise distal neurosensory exam is intact  Imaging:   Xray (right hip 4 views, right knee 4 views): Normal  MRI lumbar spine: L5-S1 right-sided paracentral disc herniation  I personally reviewed and interpreted the radiographs.   Assessment:   37 y.o. female with right lower leg pain consistent with radiculitis in the setting of her right L5-S1 paracentral disc herniation.  Given her young age I do believe it would be  beneficial to start with an injection.  Will plan to refer her to Dr. Alvester Morin for this.  I will plan to see her back as needed  Plan :    -Return to should her pain recur after injection     I personally saw and evaluated the patient, and participated in the management and treatment plan.  Huel Cote, MD Attending Physician, Orthopedic Surgery  This document was dictated using Dragon voice recognition software. A reasonable attempt at proof reading has been made to minimize errors.

## 2023-02-27 ENCOUNTER — Telehealth: Payer: Self-pay

## 2023-02-27 NOTE — Telephone Encounter (Signed)
I called the patient to see how she is feeling, as we wait for an appointment for an epidural steroid injection with Dr. Alvester Morin, here in our office. Today, her pain level is a 7. Her pain averages between a 7-9. She is taking Ibuprofen 600 mg tid prn pain, along with Baclofen 5 mg prn for muscle spasm. She is also resting from any activities that aggravate her lower back. She will continue these conservative measures through to her esi - she will follow up with Dr.Bokshan &/or Dr. Alvester Morin to determine the next treatment steps, depending on how she responds to this injection.

## 2023-03-12 ENCOUNTER — Telehealth: Payer: Self-pay

## 2023-03-15 NOTE — Telephone Encounter (Signed)
Error

## 2023-04-02 ENCOUNTER — Other Ambulatory Visit: Payer: Self-pay

## 2023-04-02 ENCOUNTER — Ambulatory Visit: Payer: Medicaid Other | Admitting: Physical Medicine and Rehabilitation

## 2023-04-02 DIAGNOSIS — M5416 Radiculopathy, lumbar region: Secondary | ICD-10-CM

## 2023-04-02 MED ORDER — METHYLPREDNISOLONE ACETATE 40 MG/ML IJ SUSP
40.0000 mg | Freq: Once | INTRAMUSCULAR | Status: AC
  Administered 2023-04-02: 40 mg

## 2023-04-02 NOTE — Patient Instructions (Signed)
[facility] Physiatry Discharge Instructions  *At any time if you have questions or concerns they can be answered by calling [phone]  All Patients: You may experience an increase in your symptoms for the first 2 days (it can take 2 days to 2 weeks for the steroid/cortisone to have its maximal effect). You may use ice to the site for the first 24 hours; 20 minutes on and 20 minutes off and may use heat after that time. You may resume and continue your current pain medications. If you need a refill please contact the prescribing physician. You may resume your medications if any were stopped for the procedure. You may shower but no swimming, tub bath or Jacuzzi for 24 hours. Please remove bandage after 4 hours. You may resume light activities as tolerated. If you had Spine Injection, you should not drive for the next 3 hours due to anesthetics used in the procedure. Please have someone drive for you.  *If you have had sedation, Valium, Xanax, or lorazepam: Do not drive or use public transportation for 24 hours, do not operating hazardous machinery or make important personal/business decisions for 24 hours.  POSSIBLE STEROID SIDE EFFECTS: If experienced these should only last for a short period. Change in menstrual flow  Edema in (swelling)  Increased appetite Skin flushing (redness)  Skin rash/acne  Thrush (oral) Vaginitis    Increased sweating  Depression Increased blood glucose levels Cramping and leg/calf  Euphoria (feeling happy)  POSSIBLE PROCEDURE SIDE EFFECTS: Please call our office if concerned. Increased pain Increased numbness/tingling  Headache Nausea/vomiting Hematoma (bruising/bleeding) Edema (swelling at the site) Weakness  Infection (red/drainage at site) Fever greater than 100.27F  *In the event of a headache after epidural steroid injection: Drink plenty of fluids, especially water and try to lay flat when possible. If the headache does not get better after a few days  or as always if concerned please call the office.

## 2023-04-06 NOTE — Progress Notes (Signed)
Patricia Brock - 37 y.o. female MRN [id number]  Date of birth: 1985-06-11  Office Visit Note: Visit Date: 04/02/2023 PCP: [facility], [facility] Referred by: [facility], [employer]*  Subjective: Chief Complaint  Patient presents with   Lower Back - Pain   HPI:  Patricia Brock is a 37 y.o. female who comes in today at the request of Dr. Huel Cote for planned Right L5-S1 Lumbar Interlaminar epidural steroid injection with fluoroscopic guidance.  The patient has failed conservative care including home exercise, medications, time and activity modification.  This injection will be diagnostic and hopefully therapeutic.  Please see requesting physician notes for further details and justification.   ROS Otherwise per HPI.  Assessment & Plan: Visit Diagnoses:    ICD-10-CM   1. Lumbar radiculopathy  M54.16 methylPREDNISolone acetate (DEPO-MEDROL) injection 40 mg    XR C-ARM NO REPORT    Epidural Steroid injection      Plan: No additional findings.   Meds & Orders:  Meds ordered this encounter  Medications   methylPREDNISolone acetate (DEPO-MEDROL) injection 40 mg    Orders Placed This Encounter  Procedures   XR C-ARM NO REPORT   Epidural Steroid injection    Follow-up: Return for visit to requesting provider as needed.   Procedures: No procedures performed  Lumbar Epidural Steroid Injection - Interlaminar Approach with Fluoroscopic Guidance  Patient: Patricia Brock      Date of Birth: 1985/11/19 MRN: [id number] PCP: [facility], [facility]      Visit Date: 04/02/2023   Universal Protocol:     Consent Given By: the patient  Position: PRONE  Additional Comments: Vital signs were monitored before and after the procedure. Patient was prepped and draped in the usual sterile fashion. The correct patient, procedure, and site was verified.   Injection Procedure Details:   Procedure diagnoses: Lumbar radiculopathy [M54.16]   Meds Administered:  Meds  ordered this encounter  Medications   methylPREDNISolone acetate (DEPO-MEDROL) injection 40 mg     Laterality: Right  Location/Site:  L5-S1  Needle: 6.0 in., 20 ga. Tuohy  Needle Placement: Paramedian epidural  Findings:   -Comments: Excellent flow of contrast into the epidural space.  Procedure Details: Using a paramedian approach from the side mentioned above, the region overlying the inferior lamina was localized under fluoroscopic visualization and the soft tissues overlying this structure were infiltrated with 4 ml. of 1% Lidocaine without Epinephrine. The Tuohy needle was inserted into the epidural space using a paramedian approach.   The epidural space was localized using loss of resistance along with counter oblique bi-planar fluoroscopic views.  After negative aspirate for air, blood, and CSF, a 2 ml. volume of Isovue-250 was injected into the epidural space and the flow of contrast was observed. Radiographs were obtained for documentation purposes.    The injectate was administered into the level noted above.   Additional Comments:  No complications occurred Dressing: 2 x 2 sterile gauze and Band-Aid    Post-procedure details: Patient was observed during the procedure. Post-procedure instructions were reviewed.  Patient left the clinic in stable condition.   Clinical History: CLINICAL DATA:  Lumbar radiculopathy, symptoms persist with > 6 wks treatment   EXAM: MRI LUMBAR SPINE WITHOUT CONTRAST   TECHNIQUE: Multiplanar, multisequence MR imaging of the lumbar spine was performed. No intravenous contrast was administered.   COMPARISON:  None Available.   FINDINGS: Segmentation:  Standard.   Alignment:  Physiologic.   Vertebrae: Degenerative endplate changes of the inferior and  superior endplates of L5 on S1.   Conus medullaris and cauda equina: Conus extends to the L1 level. Conus and cauda equina appear normal.   Paraspinal and other soft tissues:  Negative.   Disc levels:   T12-L1: Unremarkable   L1-L2: Unremarkable   L2-L3: Mild bilateral facet degenerative change. No spinal canal narrowing. No neural foraminal narrowing.   L3-L4: Mild bilateral facet degenerative change fluid in the facet joints. No significant disc bulge. No spinal canal narrowing. Mild bilateral neural foraminal narrowing.   L4-L5: Mild bilateral facet degenerative change. Eccentric left disc bulge. No spinal canal narrowing. Mild bilateral neural foraminal narrowing.   L5-S1: Moderate bilateral facet degenerative change with fluid in the facet joints. Severe left and moderate right neural foraminal narrowing. There is nonspecific T2 hyperintense signal within the left lateral aspect of the spinal canal at the L5-S1 level (series 113, image 41). This could potentially represent a synovial cyst or a disc extrusion. This severely narrows the left lateral recess.   IMPRESSION: 1. Nonspecific T2 hypointense signal within the left lateral aspect of the spinal canal at the L5-S1 level. This could potentially represent a synovial cyst or a disc extrusion. This severely narrows the left lateral recess and likely impinges the traversing left S1 nerve root. 2. Severe left and moderate right neural foraminal narrowing at L5-S1.     Electronically Signed   By: Lorenza Cambridge M.D.   On: 02/21/2023 20:48     Objective:  VS:  HT:    WT:   BMI:     BP:   HR: bpm  TEMP: ( )  RESP:  Physical Exam Vitals and nursing note reviewed.  Constitutional:      General: She is not in acute distress.    Appearance: Normal appearance. She is well-developed. She is obese. She is not ill-appearing.  HENT:     Head: Normocephalic and atraumatic.     Right Ear: External ear normal.     Left Ear: External ear normal.  Eyes:     Extraocular Movements: Extraocular movements intact.     Conjunctiva/sclera: Conjunctivae normal.     Pupils: Pupils are equal, round, and  reactive to light.  Cardiovascular:     Rate and Rhythm: Normal rate.     Pulses: Normal pulses.  Pulmonary:     Effort: Pulmonary effort is normal. No respiratory distress.  Abdominal:     General: There is no distension.     Palpations: Abdomen is soft.  Musculoskeletal:        General: Tenderness present.     Cervical back: Neck supple.     Right lower leg: No edema.     Left lower leg: No edema.     Comments: Patient has good distal strength with no pain over the greater trochanters.  No clonus or focal weakness.  Skin:    General: Skin is warm and dry.     Findings: No erythema, lesion or rash.  Neurological:     General: No focal deficit present.     Mental Status: She is alert and oriented to person, place, and time.     Sensory: No sensory deficit.     Motor: No weakness or abnormal muscle tone.     Coordination: Coordination normal.     Gait: Gait normal.  Psychiatric:        Mood and Affect: Mood normal.        Behavior: Behavior normal.  Imaging: No results found.

## 2023-04-06 NOTE — Procedures (Signed)
Lumbar Epidural Steroid Injection - Interlaminar Approach with Fluoroscopic Guidance  Patient: Patricia Brock      Date of Birth: 04-28-1986 MRN: [id number] PCP: [facility]      Visit Date: 04/02/2023   Universal Protocol:     Consent Given By: the patient  Position: PRONE  Additional Comments: Vital signs were monitored before and after the procedure. Patient was prepped and draped in the usual sterile fashion. The correct patient, procedure, and site was verified.   Injection Procedure Details:   Procedure diagnoses: Lumbar radiculopathy [M54.16]   Meds Administered:  Meds ordered this encounter  Medications   methylPREDNISolone acetate (DEPO-MEDROL) injection 40 mg     Laterality: Right  Location/Site:  L5-S1  Needle: 6.0 in., 20 ga. Tuohy  Needle Placement: Paramedian epidural  Findings:   -Comments: Excellent flow of contrast into the epidural space.  Procedure Details: Using a paramedian approach from the side mentioned above, the region overlying the inferior lamina was localized under fluoroscopic visualization and the soft tissues overlying this structure were infiltrated with 4 ml. of 1% Lidocaine without Epinephrine. The Tuohy needle was inserted into the epidural space using a paramedian approach.   The epidural space was localized using loss of resistance along with counter oblique bi-planar fluoroscopic views.  After negative aspirate for air, blood, and CSF, a 2 ml. volume of Isovue-250 was injected into the epidural space and the flow of contrast was observed. Radiographs were obtained for documentation purposes.    The injectate was administered into the level noted above.   Additional Comments:  No complications occurred Dressing: 2 x 2 sterile gauze and Band-Aid    Post-procedure details: Patient was observed during the procedure. Post-procedure instructions were reviewed.  Patient left the clinic in stable condition.

## 2023-04-20 ENCOUNTER — Encounter (HOSPITAL_BASED_OUTPATIENT_CLINIC_OR_DEPARTMENT_OTHER): Payer: Self-pay | Admitting: Orthopaedic Surgery

## 2023-04-20 ENCOUNTER — Encounter: Payer: Self-pay | Admitting: Physical Medicine and Rehabilitation

## 2023-05-10 ENCOUNTER — Ambulatory Visit (HOSPITAL_BASED_OUTPATIENT_CLINIC_OR_DEPARTMENT_OTHER): Payer: Medicaid Other | Admitting: Orthopaedic Surgery

## 2023-05-14 ENCOUNTER — Ambulatory Visit: Payer: Medicaid Other | Admitting: Physical Medicine and Rehabilitation

## 2023-05-14 ENCOUNTER — Encounter: Payer: Self-pay | Admitting: Physical Medicine and Rehabilitation

## 2023-05-14 DIAGNOSIS — M545 Low back pain, unspecified: Secondary | ICD-10-CM

## 2023-05-14 DIAGNOSIS — M47816 Spondylosis without myelopathy or radiculopathy, lumbar region: Secondary | ICD-10-CM | POA: Diagnosis not present

## 2023-05-14 DIAGNOSIS — M47819 Spondylosis without myelopathy or radiculopathy, site unspecified: Secondary | ICD-10-CM | POA: Diagnosis not present

## 2023-05-14 DIAGNOSIS — G8929 Other chronic pain: Secondary | ICD-10-CM

## 2023-05-14 NOTE — Progress Notes (Signed)
 Patricia Brock - 38 y.o. female MRN [id number]  Date of birth: November 26, 1985  Office Visit Note: Visit Date: 05/14/2023 PCP: [facility] Referred by: [facility], [employer]*  Subjective: Chief Complaint  Patient presents with   Lower Back - Follow-up   HPI: Patricia Brock is a 38 y.o. female who comes in today for evaluation of chronic, worsening and severe bilateral lower back pain, intermittent pain radiating to right anterolateral thigh down to knee. Bilateral lower back seems to be biggest generator of pain. Pain ongoing for several months, worsens when moving from sitting to standing position. She describes her pain as crunching and sore sensation, currently rates as 8 out of 10. Some relief of pain with home exercise regimen, rest and use of medications. History of formal physical therapy with minimal relief of pain. Recent lumbar MRI imaging shows nonspecific T2 hypointense signal within the left lateral aspect of the spinal canal at the L5-S1 level. This could represent a synovial cyst and is causing severe left lateral recess stenosis and impingement of left S1 nerve root. There is also moderate bilateral facet degenerative change with fluid in the facet joints at L5-S1. Patient recently underwent right L5-S1 interlaminar epidural steroid injection in our office on 04/02/2023, no relief of pain with this procedure. Patient denies focal weakness, numbness and tingling. No recent trauma or falls.      Review of Systems  Musculoskeletal:  Positive for back pain.  Neurological:  Negative for tingling, sensory change, focal weakness and weakness.  All other systems reviewed and are negative.  Otherwise per HPI.  Assessment & Plan: Visit Diagnoses:    ICD-10-CM   1. Chronic bilateral low back pain without sciatica  M54.50 Ambulatory referral to Physical Medicine Rehab   G89.29     2. Spondylosis without myelopathy or radiculopathy  M47.819 Ambulatory referral to  Physical Medicine Rehab    3. Facet arthropathy, lumbar  M47.816 Ambulatory referral to Physical Medicine Rehab    4. Morbid (severe) obesity due to excess calories (HCC)  E66.01        Plan: Findings:  Chronic, worsening and severe bilateral lower back pain, intermittent radiation of pain to right anterolateral thigh to knee.  Patient continues to have severe pain despite good conservative therapy such as formal physical therapy, home exercise regimen, rest and use of medications.  Patient's clinical presentation and exam are consistent with facet mediated pain.  There is moderate bilateral facet arthropathy with noted fluid signals at the level of L5-S1 on recent lumbar MRI imaging.  She does have severe pain when moving from sitting to standing position, does exhibit pain with lumbar extension on exam today.  I do feel her intermittent pain radiating down the right leg is more of a referred facet joint syndrome.  Next step is to perform diagnostic and hopefully therapeutic bilateral L5-S1 facet joint injections under fluoroscopic guidance.  If good relief of pain with facet joint injections we did discuss the possibility of longer sustained pain relief with radiofrequency ablation procedure.  Dr. Eldonna at bedside to discuss facet joint injection procedure, she has no questions at this time. No red flag symptoms noted upon exam today.     Meds & Orders: No orders of the defined types were placed in this encounter.   Orders Placed This Encounter  Procedures   Ambulatory referral to Physical Medicine Rehab    Follow-up: Return for Bilateral L5-S1 facet joint injections..   Procedures: No procedures performed  Clinical History: CLINICAL DATA:  Lumbar radiculopathy, symptoms persist with > 6 wks treatment   EXAM: MRI LUMBAR SPINE WITHOUT CONTRAST   TECHNIQUE: Multiplanar, multisequence MR imaging of the lumbar spine was performed. No intravenous contrast was administered.    COMPARISON:  None Available.   FINDINGS: Segmentation:  Standard.   Alignment:  Physiologic.   Vertebrae: Degenerative endplate changes of the inferior and superior endplates of L5 on S1.   Conus medullaris and cauda equina: Conus extends to the L1 level. Conus and cauda equina appear normal.   Paraspinal and other soft tissues: Negative.   Disc levels:   T12-L1: Unremarkable   L1-L2: Unremarkable   L2-L3: Mild bilateral facet degenerative change. No spinal canal narrowing. No neural foraminal narrowing.   L3-L4: Mild bilateral facet degenerative change fluid in the facet joints. No significant disc bulge. No spinal canal narrowing. Mild bilateral neural foraminal narrowing.   L4-L5: Mild bilateral facet degenerative change. Eccentric left disc bulge. No spinal canal narrowing. Mild bilateral neural foraminal narrowing.   L5-S1: Moderate bilateral facet degenerative change with fluid in the facet joints. Severe left and moderate right neural foraminal narrowing. There is nonspecific T2 hyperintense signal within the left lateral aspect of the spinal canal at the L5-S1 level (series 113, image 41). This could potentially represent a synovial cyst or a disc extrusion. This severely narrows the left lateral recess.   IMPRESSION: 1. Nonspecific T2 hypointense signal within the left lateral aspect of the spinal canal at the L5-S1 level. This could potentially represent a synovial cyst or a disc extrusion. This severely narrows the left lateral recess and likely impinges the traversing left S1 nerve root. 2. Severe left and moderate right neural foraminal narrowing at L5-S1.     Electronically Signed   By: Lyndall Gore M.D.   On: 02/21/2023 20:48   She reports that she has never smoked. She has never been exposed to tobacco smoke. She has never used smokeless tobacco. No results for input(s): HGBA1C, LABURIC in the last 8760 hours.  Objective:  VS:  HT:    WT:    BMI:     BP:   HR: bpm  TEMP: ( )  RESP:  Physical Exam Vitals and nursing note reviewed.  HENT:     Head: Normocephalic and atraumatic.     Right Ear: External ear normal.     Left Ear: External ear normal.     Nose: Nose normal.     Mouth/Throat:     Mouth: Mucous membranes are moist.  Eyes:     Extraocular Movements: Extraocular movements intact.  Cardiovascular:     Rate and Rhythm: Normal rate.     Pulses: Normal pulses.  Pulmonary:     Effort: Pulmonary effort is normal.  Abdominal:     General: Abdomen is flat. There is no distension.  Musculoskeletal:        General: Tenderness present.     Cervical back: Normal range of motion.     Comments: Patient rises from seated position to standing without difficulty. Pain noted to facet loading and lumbar extension. 5/5 strength noted with bilateral hip flexion, knee flexion/extension, ankle dorsiflexion/plantarflexion and EHL. No clonus noted bilaterally. No pain upon palpation of greater trochanters. No pain with internal/external rotation of bilateral hips. Sensation intact bilaterally. Negative slump test bilaterally. Ambulates without aid, gait steady.     Skin:    General: Skin is warm and dry.     Capillary Refill: Capillary refill  takes less than 2 seconds.  Neurological:     General: No focal deficit present.     Mental Status: She is alert and oriented to person, place, and time.  Psychiatric:        Mood and Affect: Mood normal.        Behavior: Behavior normal.     Ortho Exam  Imaging: No results found.  Past Medical/Family/Surgical/Social History: Medications & Allergies reviewed per EMR, new medications updated. Patient Active Problem List   Diagnosis Date Noted   Abnormal Pap smear of cervix 01/25/2020   Morbid obesity (HCC) BMI=56.4 (360 lbs) 02/13/2019   Elevated BP without diagnosis of hypertension 161/97, 162/101 02/13/2019   Asthma 02/13/2019   Past Medical History:  Diagnosis Date   Asthma     Herpes simplex virus (HSV) infection    Hypertension    Vaginal Pap smear, abnormal    Family History  Problem Relation Age of Onset   Cancer Maternal Grandmother    Hypertension Mother    Past Surgical History:  Procedure Laterality Date   LEEP     Social History   Occupational History   Not on file  Tobacco Use   Smoking status: Never    Passive exposure: Never   Smokeless tobacco: Never  Vaping Use   Vaping status: Never Used  Substance and Sexual Activity   Alcohol use: Yes    Comment: occasional   Drug use: No   Sexual activity: Yes    Partners: Male    Birth control/protection: Implant, Pill, Injection

## 2023-05-15 ENCOUNTER — Ambulatory Visit (HOSPITAL_BASED_OUTPATIENT_CLINIC_OR_DEPARTMENT_OTHER): Payer: Medicaid Other | Admitting: Orthopaedic Surgery

## 2023-06-04 ENCOUNTER — Other Ambulatory Visit: Payer: Self-pay

## 2023-06-04 ENCOUNTER — Ambulatory Visit: Payer: Medicaid Other | Admitting: Physical Medicine and Rehabilitation

## 2023-06-04 DIAGNOSIS — M47816 Spondylosis without myelopathy or radiculopathy, lumbar region: Secondary | ICD-10-CM | POA: Diagnosis not present

## 2023-06-04 MED ORDER — METHYLPREDNISOLONE ACETATE 40 MG/ML IJ SUSP
40.0000 mg | Freq: Once | INTRAMUSCULAR | Status: AC
  Administered 2023-06-04: 40 mg

## 2023-06-04 NOTE — Patient Instructions (Signed)

## 2023-06-04 NOTE — Progress Notes (Unsigned)
Functional Pain Scale - descriptive words and definitions  Uncomfortable (3)  Pain is present but can complete all ADL's/sleep is slightly affected and passive distraction only gives marginal relief. Mild range order  Average Pain 4   +Driver, -BT, -Dye Allergies.

## 2023-06-06 NOTE — Procedures (Signed)
Lumbar Facet Joint Intra-Articular Injection(s) with Fluoroscopic Guidance  Patient: Patricia Brock      Date of Birth: 1986-03-18 MRN: [id number] PCP: [facility], [facility]      Visit Date: 06/04/2023   Universal Protocol:    Date/Time: 06/04/2023  Consent Given By: the patient  Position: PRONE   Additional Comments: Vital signs were monitored before and after the procedure. Patient was prepped and draped in the usual sterile fashion. The correct patient, procedure, and site was verified.   Injection Procedure Details:  Procedure Site One Meds Administered:  Meds ordered this encounter  Medications   methylPREDNISolone acetate (DEPO-MEDROL) injection 40 mg     Laterality: Bilateral  Location/Site:  L5-S1  Needle size: 22 guage  Needle type: Spinal  Needle Placement: Articular  Findings:  -Comments: Excellent flow of contrast producing a partial arthrogram.  Procedure Details: The fluoroscope beam is vertically oriented in AP, and the inferior recess is visualized beneath the lower pole of the inferior apophyseal process, which represents the target point for needle insertion. When direct visualization is difficult the target point is located at the medial projection of the vertebral pedicle. The region overlying each aforementioned target is locally anesthetized with a 1 to 2 ml. volume of 1% Lidocaine without Epinephrine.   The spinal needle was inserted into each of the above mentioned facet joints using biplanar fluoroscopic guidance. A 0.25 to 0.5 ml. volume of Isovue-250 was injected and a partial facet joint arthrogram was obtained. A single spot film was obtained of the resulting arthrogram.    One to 1.25 ml of the steroid/anesthetic solution was then injected into each of the facet joints noted above.   Additional Comments:  No complications occurred Dressing: 2 x 2 sterile gauze and Band-Aid    Post-procedure details: Patient was observed during  the procedure. Post-procedure instructions were reviewed.  Patient left the clinic in stable condition.

## 2023-06-06 NOTE — Progress Notes (Signed)
Patricia Brock - 38 y.o. female MRN [id number]  Date of birth: 02-18-1986  Office Visit Note: Visit Date: 06/04/2023 PCP: [facility], [facility] Referred by: [facility], [employer]*  Subjective: Chief Complaint  Patient presents with   Lower Back - Pain   HPI:  Patricia Brock is a 38 y.o. female who comes in today at the request of Ellin Goodie, FNP for planned Bilateral  L5-S1 Lumbar facet/medial branch block with fluoroscopic guidance.  The patient has failed conservative care including home exercise, medications, time and activity modification.  This injection will be diagnostic and hopefully therapeutic.  Please see requesting physician notes for further details and justification.  Exam has shown concordant pain with facet joint loading.   ROS Otherwise per HPI.  Assessment & Plan: Visit Diagnoses:    ICD-10-CM   1. Spondylosis without myelopathy or radiculopathy, lumbar region  M47.816 methylPREDNISolone acetate (DEPO-MEDROL) injection 40 mg    XR C-ARM NO REPORT    Facet Injection      Plan: No additional findings.   Meds & Orders:  Meds ordered this encounter  Medications   methylPREDNISolone acetate (DEPO-MEDROL) injection 40 mg    Orders Placed This Encounter  Procedures   Facet Injection   XR C-ARM NO REPORT    Follow-up: Return for visit to requesting provider as needed.   Procedures: No procedures performed  Lumbar Facet Joint Intra-Articular Injection(s) with Fluoroscopic Guidance  Patient: Patricia Brock      Date of Birth: 06-01-1985 MRN: [id number] PCP: [facility], [facility]      Visit Date: 06/04/2023   Universal Protocol:    Date/Time: 06/04/2023  Consent Given By: the patient  Position: PRONE   Additional Comments: Vital signs were monitored before and after the procedure. Patient was prepped and draped in the usual sterile fashion. The correct patient, procedure, and site was verified.   Injection Procedure Details:   Procedure Site One Meds Administered:  Meds ordered this encounter  Medications   methylPREDNISolone acetate (DEPO-MEDROL) injection 40 mg     Laterality: Bilateral  Location/Site:  L5-S1  Needle size: 22 guage  Needle type: Spinal  Needle Placement: Articular  Findings:  -Comments: Excellent flow of contrast producing a partial arthrogram.  Procedure Details: The fluoroscope beam is vertically oriented in AP, and the inferior recess is visualized beneath the lower pole of the inferior apophyseal process, which represents the target point for needle insertion. When direct visualization is difficult the target point is located at the medial projection of the vertebral pedicle. The region overlying each aforementioned target is locally anesthetized with a 1 to 2 ml. volume of 1% Lidocaine without Epinephrine.   The spinal needle was inserted into each of the above mentioned facet joints using biplanar fluoroscopic guidance. A 0.25 to 0.5 ml. volume of Isovue-250 was injected and a partial facet joint arthrogram was obtained. A single spot film was obtained of the resulting arthrogram.    One to 1.25 ml of the steroid/anesthetic solution was then injected into each of the facet joints noted above.   Additional Comments:  No complications occurred Dressing: 2 x 2 sterile gauze and Band-Aid    Post-procedure details: Patient was observed during the procedure. Post-procedure instructions were reviewed.  Patient left the clinic in stable condition.    Clinical History: CLINICAL DATA:  Lumbar radiculopathy, symptoms persist with > 6 wks treatment   EXAM: MRI LUMBAR SPINE WITHOUT CONTRAST   TECHNIQUE: Multiplanar, multisequence MR imaging of the lumbar  spine was performed. No intravenous contrast was administered.   COMPARISON:  None Available.   FINDINGS: Segmentation:  Standard.   Alignment:  Physiologic.   Vertebrae: Degenerative endplate changes of the inferior  and superior endplates of L5 on S1.   Conus medullaris and cauda equina: Conus extends to the L1 level. Conus and cauda equina appear normal.   Paraspinal and other soft tissues: Negative.   Disc levels:   T12-L1: Unremarkable   L1-L2: Unremarkable   L2-L3: Mild bilateral facet degenerative change. No spinal canal narrowing. No neural foraminal narrowing.   L3-L4: Mild bilateral facet degenerative change fluid in the facet joints. No significant disc bulge. No spinal canal narrowing. Mild bilateral neural foraminal narrowing.   L4-L5: Mild bilateral facet degenerative change. Eccentric left disc bulge. No spinal canal narrowing. Mild bilateral neural foraminal narrowing.   L5-S1: Moderate bilateral facet degenerative change with fluid in the facet joints. Severe left and moderate right neural foraminal narrowing. There is nonspecific T2 hyperintense signal within the left lateral aspect of the spinal canal at the L5-S1 level (series 113, image 41). This could potentially represent a synovial cyst or a disc extrusion. This severely narrows the left lateral recess.   IMPRESSION: 1. Nonspecific T2 hypointense signal within the left lateral aspect of the spinal canal at the L5-S1 level. This could potentially represent a synovial cyst or a disc extrusion. This severely narrows the left lateral recess and likely impinges the traversing left S1 nerve root. 2. Severe left and moderate right neural foraminal narrowing at L5-S1.     Electronically Signed   By: Lorenza Cambridge M.D.   On: 02/21/2023 20:48     Objective:  VS:  HT:    WT:   BMI:     BP:   HR: bpm  TEMP: ( )  RESP:  Physical Exam Vitals and nursing note reviewed.  Constitutional:      General: She is not in acute distress.    Appearance: Normal appearance. She is obese. She is not ill-appearing.  HENT:     Head: Normocephalic and atraumatic.     Right Ear: External ear normal.     Left Ear: External ear  normal.  Eyes:     Extraocular Movements: Extraocular movements intact.  Cardiovascular:     Rate and Rhythm: Normal rate.     Pulses: Normal pulses.  Pulmonary:     Effort: Pulmonary effort is normal. No respiratory distress.  Abdominal:     General: There is no distension.     Palpations: Abdomen is soft.  Musculoskeletal:        General: Tenderness present.     Cervical back: Neck supple.     Right lower leg: No edema.     Left lower leg: No edema.     Comments: Patient has good distal strength with no pain over the greater trochanters.  No clonus or focal weakness.  Skin:    Findings: No erythema, lesion or rash.  Neurological:     General: No focal deficit present.     Mental Status: She is alert and oriented to person, place, and time.     Sensory: No sensory deficit.     Motor: No weakness or abnormal muscle tone.     Coordination: Coordination normal.  Psychiatric:        Mood and Affect: Mood normal.        Behavior: Behavior normal.      Imaging: No results found.

## 2024-03-09 ENCOUNTER — Encounter: Payer: Self-pay | Admitting: Radiology
